# Patient Record
Sex: Male | Born: 2000 | Race: Black or African American | Hispanic: No | Marital: Single
Health system: Southern US, Community
[De-identification: ages and names within clinical notes are randomized; demographics above are authoritative.]

## PROBLEM LIST (undated history)

## (undated) ENCOUNTER — Emergency Department (HOSPITAL_COMMUNITY): Discharge status: Absent without leave | Payer: Medicaid Other

## (undated) DIAGNOSIS — F840 Autistic disorder: Secondary | ICD-10-CM

## (undated) DIAGNOSIS — G0490 Encephalitis and encephalomyelitis, unspecified: Secondary | ICD-10-CM

## (undated) DIAGNOSIS — F809 Developmental disorder of speech and language, unspecified: Secondary | ICD-10-CM

## (undated) DIAGNOSIS — R569 Unspecified convulsions: Secondary | ICD-10-CM

## (undated) DIAGNOSIS — E079 Disorder of thyroid, unspecified: Secondary | ICD-10-CM

## (undated) DIAGNOSIS — F79 Unspecified intellectual disabilities: Secondary | ICD-10-CM

## (undated) HISTORY — PX: GASTROSTOMY TUBE REVISION: SHX1704

## (undated) HISTORY — PX: BRAIN SURGERY: SHX531

---
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
## 2019-06-28 ENCOUNTER — Other Ambulatory Visit: Payer: Self-pay

## 2019-06-28 ENCOUNTER — Emergency Department (HOSPITAL_COMMUNITY)
Admission: EM | Admit: 2019-06-28 | Discharge: 2019-06-28 | Disposition: A | Discharge status: Home / Self Care | Payer: Medicaid Other | Attending: Emergency Medicine | Admitting: Emergency Medicine

## 2019-06-28 DIAGNOSIS — Z7722 Contact with and (suspected) exposure to environmental tobacco smoke (acute) (chronic): Secondary | ICD-10-CM | POA: Diagnosis not present

## 2019-06-28 DIAGNOSIS — F840 Autistic disorder: Secondary | ICD-10-CM | POA: Insufficient documentation

## 2019-06-28 DIAGNOSIS — Z79899 Other long term (current) drug therapy: Secondary | ICD-10-CM | POA: Insufficient documentation

## 2019-06-28 DIAGNOSIS — R569 Unspecified convulsions: Secondary | ICD-10-CM | POA: Diagnosis not present

## 2019-06-28 DIAGNOSIS — E039 Hypothyroidism, unspecified: Secondary | ICD-10-CM | POA: Diagnosis not present

## 2019-06-28 LAB — URINALYSIS, ROUTINE W REFLEX MICROSCOPIC
Bilirubin Urine: NEGATIVE
Glucose, UA: NEGATIVE mg/dL
Hgb urine dipstick: NEGATIVE
Ketones, ur: NEGATIVE mg/dL
Leukocytes,Ua: NEGATIVE
Nitrite: NEGATIVE
Protein, ur: NEGATIVE mg/dL
Specific Gravity, Urine: 1.02 (ref 1.005–1.030)
pH: 7 (ref 5.0–8.0)

## 2019-06-28 LAB — RAPID URINE DRUG SCREEN, HOSP PERFORMED
Amphetamines: NOT DETECTED
Barbiturates: NOT DETECTED
Benzodiazepines: POSITIVE — AB
Cocaine: NOT DETECTED
Opiates: NOT DETECTED
Tetrahydrocannabinol: NOT DETECTED

## 2019-06-28 LAB — CBC WITH DIFFERENTIAL/PLATELET
Abs Immature Granulocytes: 0 10*3/uL (ref 0.00–0.07)
Basophils Absolute: 0 10*3/uL (ref 0.0–0.1)
Basophils Relative: 0 %
Eosinophils Absolute: 0.1 10*3/uL (ref 0.0–0.5)
Eosinophils Relative: 3 %
HCT: 44.5 % (ref 39.0–52.0)
Hemoglobin: 14.2 g/dL (ref 13.0–17.0)
Immature Granulocytes: 0 %
Lymphocytes Relative: 46 %
Lymphs Abs: 1.6 10*3/uL (ref 0.7–4.0)
MCH: 28.9 pg (ref 26.0–34.0)
MCHC: 31.9 g/dL (ref 30.0–36.0)
MCV: 90.4 fL (ref 80.0–100.0)
Monocytes Absolute: 0.5 10*3/uL (ref 0.1–1.0)
Monocytes Relative: 13 %
Neutro Abs: 1.3 10*3/uL — ABNORMAL LOW (ref 1.7–7.7)
Neutrophils Relative %: 38 %
Platelets: 174 10*3/uL (ref 150–400)
RBC: 4.92 MIL/uL (ref 4.22–5.81)
RDW: 11.9 % (ref 11.5–15.5)
WBC: 3.5 10*3/uL — ABNORMAL LOW (ref 4.0–10.5)
nRBC: 0 % (ref 0.0–0.2)

## 2019-06-28 LAB — COMPREHENSIVE METABOLIC PANEL
ALT: 18 U/L (ref 0–44)
AST: 18 U/L (ref 15–41)
Albumin: 3.9 g/dL (ref 3.5–5.0)
Alkaline Phosphatase: 81 U/L (ref 38–126)
Anion gap: 7 (ref 5–15)
BUN: 9 mg/dL (ref 6–20)
CO2: 28 mmol/L (ref 22–32)
Calcium: 9.4 mg/dL (ref 8.9–10.3)
Chloride: 104 mmol/L (ref 98–111)
Creatinine, Ser: 0.67 mg/dL (ref 0.61–1.24)
GFR calc Af Amer: >60 mL/min (ref >60)
GFR calc non Af Amer: >60 mL/min (ref >60)
Glucose, Bld: 99 mg/dL (ref 70–99)
Potassium: 4.1 mmol/L (ref 3.5–5.1)
Sodium: 139 mmol/L (ref 135–145)
Total Bilirubin: 0.6 mg/dL (ref 0.3–1.2)
Total Protein: 7.3 g/dL (ref 6.5–8.1)

## 2019-06-28 LAB — VALPROIC ACID LEVEL: Valproic Acid Lvl: 41 ug/mL — ABNORMAL LOW (ref 50.0–100.0)

## 2019-06-28 LAB — ETHANOL: Alcohol, Ethyl (B): <10 mg/dL (ref <10)

## 2019-06-28 MED ORDER — LEVETIRACETAM IN NACL 1000 MG/100ML IV SOLN
1000.0000 mg | Freq: Two times a day (BID) | INTRAVENOUS | Status: DC
  Administered 2019-06-28: 1000 mg via INTRAVENOUS
  Filled 2019-06-28: qty 100

## 2019-06-28 MED ORDER — ACETAMINOPHEN 325 MG PO TABS
650.0000 mg | ORAL_TABLET | Freq: Once | ORAL | Status: AC
  Administered 2019-06-28: 650 mg via ORAL
  Filled 2019-06-28: qty 2

## 2019-06-28 NOTE — ED Notes (Signed)
Pt discharged in police custody. Pt ambulatory in room, following commands and speaking in full sentences.

## 2019-06-28 NOTE — ED Provider Notes (Signed)
[facility] DEPT Provider Note   CSN: [id number] Arrival date & time: 06/28/19  1832     History Chief Complaint  Patient presents with  . Seizures    David [name] is a 19 y.o. male with past medical history significant for autism, moderate intellectual disability, limbic encephalitis, seizures presents emergency department today from jail with chief complaint of seizure.  He is accompanied by 2 guards.  Patient states he had a phone call with his mother this morning and she was rude and threatening to him.  After the phone call he laid down to take a nap.  He states when he woke up he had a seizure.  He was lowered to the ground by the CO, he did not fall or hit his head.  The guards at the bedside states the seizure lasted 15 minutes however they were not there to witness it and cannot describe further describe the seizure..  Patient denies urinary or bowel incontinence, biting his tongue or postictal state.  He states he was alert and oriented after the seizure.  He does endorse a mild headache now.  He states it is located his bilateral temples.  He rates it 2 out of 10 in severity.  He denies any associated neck pain.  He not take any medications for his headache prior to arrival.  Patient does state he takes Depakote and has not missed any doses.  He denies any recent illness or antibiotic use. He also denies fever, chills, chest pain, shortness of breath, visual changes, head trauma, photophobia, phonophobia, rash, thunderclap onset.  Denies any suicidal and homicidal thoughts, denies any visual or auditory hallucinations.  Chart review shows patient has had hospitalizations for seizure like activity before.  Patient has had multiple EEGs without any findings.  Last seen in the emergency room and her seizure-like activity 6 months ago and was safely discharged back to jail.    Past Medical History:  Diagnosis Date  . Autism   . Intellectual disability      Moderate  . Limbic encephalitis   . Seizures ([place])   . Speech delay   . Thyroid disease     Patient Active Problem List   Diagnosis Date Noted  . Seizure disorder ([place]) 04/24/2018  . Seizure ([place]) 04/24/2017  . Acne 11/03/2016  . Depression 11/03/2016  . Intellectual disability 11/03/2016  . 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 Epilepsy ([place]) 11/03/2016  . 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 Hypothyroidism 04/02/2008  . RHINITIS, ALLERGIC 07/26/2006    Past Surgical History:  Procedure Laterality Date  . BRAIN SURGERY     Brain biopsy  . GASTROSTOMY TUBE REVISION         No family history on file.  Social History   Tobacco Use  . Smoking status: Passive Smoke Exposure - Never Smoker  . Smokeless tobacco: Never Used  Substance Use Topics  . Alcohol use: No  . Drug use: No    Home Medications Prior to Admission medications   Medication Sig Start Date End Date Taking? Authorizing Provider  APTIOM 800 MG TABS Take 800 mg by mouth daily with supper.  04/04/17   [provider]  divalproex (DEPAKOTE ER) 250 MG 24 hr tablet Take 1,250 mg by mouth 2 (two) times daily.    [provider]  divalproex (DEPAKOTE SPRINKLE) 125 MG capsule Take 1 capsule (125 mg total) by mouth every 12 (twelve) hours. Patient not taking: Reported  on 06/07/2018 04/24/18   Maretta Bees, MD  divalproex (DEPAKOTE) 500 MG DR tablet Take 2 tablets (1,000 mg total) by mouth every 12 (twelve) hours. Patient not taking: Reported on 06/07/2018 04/24/18   Maretta Bees, MD  Eslicarbazepine Acetate 800 MG TABS Take 800 mg by mouth daily with supper. 06/16/18   Lawyer, Cristal Deer, PA-C  levETIRAcetam (KEPPRA) 1000 MG/100ML SOLN Inject 100 mLs (1,000 mg total) into the vein 2 (two) times daily. Patient not taking: Reported on 06/07/2018 04/24/18   Maretta Bees, MD  levETIRAcetam (KEPPRA) 500 MG tablet Take 500 mg by mouth 2 (two) times daily. FOR 7  DAYS 04/26/18   [provider]  levothyroxine (SYNTHROID, LEVOTHROID) 125 MCG tablet Take 62.5 mcg by mouth daily.     [provider]  Vitamin D, Cholecalciferol, 25 MCG (1000 UT) CAPS Take 1,000 capsules by mouth daily. 04/24/18   Ghimire, Werner Lean, MD    Allergies    Penicillins  Review of Systems   Review of Systems  All other systems are reviewed and are negative for acute change except as noted in the HPI.   Physical Exam Updated Vital Signs BP 120/84 (BP Location: Left Arm)   Pulse 79   Temp 98.7 F (37.1 C) (Oral)   Resp 19   SpO2 99%   Physical Exam Vitals and nursing note reviewed.  Constitutional:      General: He is not in acute distress.    Appearance: He is not ill-appearing.  HENT:     Head: Normocephalic and atraumatic.     Comments: No tenderness to palpation of skull. No deformities or crepitus noted. No open wounds, abrasions or lacerations.     Right Ear: Tympanic membrane and external ear normal.     Left Ear: Tympanic membrane and external ear normal.     Nose: Nose normal.     Mouth/Throat:     Mouth: Mucous membranes are moist.     Pharynx: Oropharynx is clear.  Eyes:     General: No scleral icterus.       Right eye: No discharge.        Left eye: No discharge.     Extraocular Movements: Extraocular movements intact.     Conjunctiva/sclera: Conjunctivae normal.     Pupils: Pupils are equal, round, and reactive to light.  Neck:     Vascular: No JVD.  Cardiovascular:     Rate and Rhythm: Normal rate and regular rhythm.     Pulses: Normal pulses.          Radial pulses are 2+ on the right side and 2+ on the left side.     Heart sounds: Normal heart sounds.  Pulmonary:     Comments: Lungs clear to auscultation in all fields. Symmetric chest rise. No wheezing, rales, or rhonchi. Abdominal:     Comments: Abdomen is soft, non-distended, and non-tender in all quadrants. No rigidity, no guarding. No peritoneal signs.    Musculoskeletal:        General: Normal range of motion.     Cervical back: Normal range of motion.  Skin:    General: Skin is warm and dry.     Capillary Refill: Capillary refill takes less than 2 seconds.  Neurological:     Mental Status: He is oriented to person, place, and time.     GCS: GCS eye subscore is 4. GCS verbal subscore is 5. GCS motor subscore is 6.  Comments: Fluent speech, no facial droop.  Speech is clear and goal oriented, follows commands CN III-XII intact, no facial droop Normal strength in upper and lower extremities bilaterally including dorsiflexion and plantar flexion, strong and equal grip strength Sensation normal to light and sharp touch Moves extremities without ataxia, coordination intact    Psychiatric:        Mood and Affect: Affect is flat.        Behavior: Behavior is withdrawn.        Thought Content: Thought content does not include homicidal or suicidal ideation.       ED Results / Procedures / Treatments   Labs (all labs ordered are listed, but only abnormal results are displayed) Labs Reviewed  VALPROIC ACID LEVEL - Abnormal; Notable for the following components:      Result Value   Valproic Acid Lvl 41 (*)    All other components within normal limits  CBC WITH DIFFERENTIAL/PLATELET - Abnormal; Notable for the following components:   WBC 3.5 (*)    Neutro Abs 1.3 (*)    All other components within normal limits  ETHANOL  COMPREHENSIVE METABOLIC PANEL  RAPID URINE DRUG SCREEN, HOSP PERFORMED  URINALYSIS, ROUTINE W REFLEX MICROSCOPIC    EKG EKG Interpretation  Date/Time:  Saturday June 28 2019 18:43:10 EST Ventricular Rate:  83 PR Interval:    QRS Duration: 89 QT Interval:  326 QTC Calculation: 383 R Axis:   66 Text Interpretation: Sinus rhythm LVH by voltage Confirmed by Virgina Norfolk [phone]) on 06/28/2019 6:48:45 PM   Radiology No results found.  Procedures Procedures (including critical care  time)  Medications Ordered in ED Medications  levETIRAcetam (KEPPRA) IVPB 1000 mg/100 mL premix (has no administration in time range)  acetaminophen (TYLENOL) tablet 650 mg (650 mg Oral Given 06/28/19 2012)    ED Course  I have reviewed the triage vital signs and the nursing notes.  Pertinent labs & imaging results that were available during my care of the patient were reviewed by me and considered in my medical decision making (see chart for details).  Clinical Course as of Jun 27 2134  Sat Jun 28, 2019  2041 RN informs me when she was starting an IV patient had an episode of upper body shaking.  RN states she got stern and told him to stop which he did.  He was alert and oriented and conversational while having this shaking.   [KA]    Clinical Course User Index [KA] Axelle Szwed, Caroleen Hamman, PA-C   MDM Rules/Calculators/A&P                      Patient seen and examined.  He is overall well-appearing, no acute distress.  He is not postictal.  He does have flat affect and is withdrawn.  Later in my exam he is more interactive and participates.  His neuro exam is normal.  There are no signs of oral injury.  He is back to his baseline. CBC with leukopenia 3.5, similar when compared to prior.  CMP unremarkable.  Ethanol level is negative.  Alcohol level 41, subtherapeutic.  Please see ED course above regarding seizure like activity.  I reassessed patient after seizure-like activity and he was normal, no postictal state.  Discussed with on-call neurologist Dr. Laurence Slate who recommends giving small Depakote loading dose as he is subtherapeutic today.  His presentation and activity here is not consistent with seizures.  Dr. Laurence Slate and I reviewed patient's history has  had EEGs and no seizure activity has been seen.  No recommendations will be made to patient's Depakote dose.  He will need to have his level rechecked within 1 to 2 weeks. Patient at baseline after Keppra load, safe to DC back to jail.  The  patient appears reasonably screened and/or stabilized for discharge and I doubt any other medical condition or other [facility] requiring further screening, evaluation, or treatment in the ED at this time prior to discharge. The patient is safe for discharge with strict return precautions discussed. Recommend pcp follow up to have Depakote level rechecked as stated above. Findings and plan of care discussed with supervising physician Dr. Lockie Mola.   Portions of this note were generated with Scientist, clinical (histocompatibility and immunogenetics). Dictation errors may occur despite best attempts at proofreading.    Final Clinical Impression(s) / ED Diagnoses Final diagnoses:  Seizure-like activity [facility])    Rx / DC Orders ED Discharge Orders    None       Sherene Sires, PA-C 06/28/19 2211    Virgina Norfolk, DO 06/29/19 1156

## 2019-06-28 NOTE — ED Notes (Signed)
Pt aware of the need for urine sample. Pt provided with urinal and cup of water.

## 2019-06-28 NOTE — ED Triage Notes (Signed)
Pt BIB [facility] from jail due to seizure like activity. Per jail pt had convulsions for 30 min, no loss of bowel or bladder, no injury to mouth, pt was assisted to the floor by staff. EMS administered 5mg  of Midazolam and convulsions immediately stopped. Pt will open eyes and shake head yes or no. Will not answer questions at this time. Pt has hx of seizures. Pt has been taking meds as prescribed and  Has not missed any doses.

## 2019-06-28 NOTE — Discharge Instructions (Addendum)
You have been seen today for seizure like activity. Please read and follow all provided instructions. Return to the emergency room for worsening condition or new concerning symptoms.    Your Depakote level was slightly low.  1. Medications:  Prescription- continue Depakote as prescribed.   Continue usual home medications Take medications as prescribed. Please review all of the medicines and only take them if you do not have an allergy to them.   2. Treatment: rest, drink plenty of fluids  3. Follow Up:  Please follow up with primary care provider in 1-2 weeks to have your depakote level rechecked.   It is also a possibility that you have an allergic reaction to any of the medicines that you have been prescribed - Everybody reacts differently to medications and while MOST people have no trouble with most medicines, you may have a reaction such as nausea, vomiting, rash, swelling, shortness of breath. If this is the case, please stop taking the medicine immediately and contact your physician.  ?

## 2019-06-28 NOTE — ED Notes (Signed)
Pt is sitting up in bed, laughing at the TV. Pt verbalized he wanted me to help hip put his gown back on. Pt is also requesting something to eat.

## 2019-06-28 NOTE — ED Notes (Signed)
Around 1930, Clinical research associate of this note was called into the room by another RN due to seizure like activity. Pt was alert during episode, followed commands and when asked to stop in order to place IV, pt stopped with the convulsions. IV was placed and no other convulsions were witnessed at the time.

## 2019-09-21 ENCOUNTER — Other Ambulatory Visit: Payer: Self-pay

## 2019-09-21 ENCOUNTER — Emergency Department (HOSPITAL_COMMUNITY)
Admission: EM | Admit: 2019-09-21 | Discharge: 2019-09-21 | Disposition: A | Discharge status: Home / Self Care | Payer: Medicaid Other | Attending: Emergency Medicine | Admitting: Emergency Medicine

## 2019-09-21 DIAGNOSIS — Z79899 Other long term (current) drug therapy: Secondary | ICD-10-CM | POA: Insufficient documentation

## 2019-09-21 DIAGNOSIS — R569 Unspecified convulsions: Secondary | ICD-10-CM | POA: Diagnosis present

## 2019-09-21 DIAGNOSIS — F840 Autistic disorder: Secondary | ICD-10-CM | POA: Insufficient documentation

## 2019-09-21 DIAGNOSIS — E039 Hypothyroidism, unspecified: Secondary | ICD-10-CM | POA: Insufficient documentation

## 2019-09-21 LAB — CBG MONITORING, ED: Glucose-Capillary: 85 mg/dL (ref 70–99)

## 2019-09-21 LAB — CBC
HCT: 46.7 % (ref 39.0–52.0)
Hemoglobin: 15.4 g/dL (ref 13.0–17.0)
MCH: 29.9 pg (ref 26.0–34.0)
MCHC: 33 g/dL (ref 30.0–36.0)
MCV: 90.7 fL (ref 80.0–100.0)
Platelets: 199 10*3/uL (ref 150–400)
RBC: 5.15 MIL/uL (ref 4.22–5.81)
RDW: 13.2 % (ref 11.5–15.5)
WBC: 4.2 10*3/uL (ref 4.0–10.5)
nRBC: 0 % (ref 0.0–0.2)

## 2019-09-21 LAB — BASIC METABOLIC PANEL
Anion gap: 10 (ref 5–15)
BUN: 17 mg/dL (ref 6–20)
CO2: 26 mmol/L (ref 22–32)
Calcium: 9.7 mg/dL (ref 8.9–10.3)
Chloride: 102 mmol/L (ref 98–111)
Creatinine, Ser: 1.11 mg/dL (ref 0.61–1.24)
GFR calc Af Amer: >60 mL/min (ref >60)
GFR calc non Af Amer: >60 mL/min (ref >60)
Glucose, Bld: 88 mg/dL (ref 70–99)
Potassium: 5.8 mmol/L — ABNORMAL HIGH (ref 3.5–5.1)
Sodium: 138 mmol/L (ref 135–145)

## 2019-09-21 LAB — POTASSIUM: Potassium: 4.7 mmol/L (ref 3.5–5.1)

## 2019-09-21 MED ORDER — LEVETIRACETAM 500 MG PO TABS
500.0000 mg | ORAL_TABLET | Freq: Once | ORAL | Status: AC
  Administered 2019-09-21: 17:00:00 500 mg via ORAL
  Filled 2019-09-21: qty 1

## 2019-09-21 NOTE — ED Notes (Signed)
[phone], Arnetha Courser, [place] [employer], legal guardian, would like updates on patient and will give him a ride home.

## 2019-09-21 NOTE — ED Provider Notes (Signed)
[facility]-EMERGENCY DEPT Provider Note   CSN: [id number] Arrival date & time: 09/21/19  1256     History Chief Complaint  Patient presents with  . Seizures    David [name] is a 19 y.o. male.  Patient presents to the ED with a chief complaint of seizure.  He is from [facility] and reportedly had two full body seizures.  He has a history of the same.  He states that he has been taking his Keppra.  He reports mild right flank pain, but denies any other symptoms.  He denies any tongue biting or falling.  He reportedly was able to sit down prior to having the seizure because he was feeling "hot."  He has no complaints now and is requesting to leave.  The history is provided by the patient. No language interpreter was used.       Past Medical History:  Diagnosis Date  . Autism   . Intellectual disability    Moderate  . Limbic encephalitis   . Seizures (HCC)   . Speech delay   . Thyroid disease     Patient Active Problem List   Diagnosis Date Noted  . Seizure disorder (HCC) 04/24/2018  . Seizure (HCC) 04/24/2017  . Acne 11/03/2016  . Depression 11/03/2016  . Intellectual disability 11/03/2016  . 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 Hypothyroidism 04/02/2008  . RHINITIS, ALLERGIC 07/26/2006    Past Surgical History:  Procedure Laterality Date  . BRAIN SURGERY     Brain biopsy  . GASTROSTOMY TUBE REVISION         No family history on file.  Social History   Tobacco Use  . Smoking status: Passive Smoke Exposure - Never Smoker  . Smokeless tobacco: Never Used  Substance Use Topics  . Alcohol use: No  . Drug use: No    Home Medications Prior to Admission medications   Medication Sig Start Date End Date Taking? Authorizing Provider  APTIOM 800 MG TABS Take 800 mg by mouth daily with supper.  04/04/17   [provider]  divalproex  (DEPAKOTE ER) 250 MG 24 hr tablet Take 1,250 mg by mouth 2 (two) times daily.    [provider]  divalproex (DEPAKOTE SPRINKLE) 125 MG capsule Take 1 capsule (125 mg total) by mouth every 12 (twelve) hours. Patient not taking: Reported on 06/07/2018 04/24/18   Maretta Bees, MD  divalproex (DEPAKOTE) 500 MG DR tablet Take 2 tablets (1,000 mg total) by mouth every 12 (twelve) hours. Patient not taking: Reported on 06/07/2018 04/24/18   Maretta Bees, MD  Eslicarbazepine Acetate 800 MG TABS Take 800 mg by mouth daily with supper. 06/16/18   Lawyer, Cristal Deer, PA-C  levETIRAcetam (KEPPRA) 1000 MG/100ML SOLN Inject 100 mLs (1,000 mg total) into the vein 2 (two) times daily. Patient not taking: Reported on 06/07/2018 04/24/18   Maretta Bees, MD  levETIRAcetam (KEPPRA) 500 MG tablet Take 500 mg by mouth 2 (two) times daily. FOR 7 DAYS 04/26/18   [provider]  levothyroxine (SYNTHROID, LEVOTHROID) 125 MCG tablet Take 62.5 mcg by mouth daily.     [provider]  Vitamin D, Cholecalciferol, 25 MCG (1000 UT) CAPS Take 1,000 capsules by mouth daily. 04/24/18   Ghimire, Werner Lean, MD    Allergies    Penicillins  Review of Systems   Review of Systems  All other systems  reviewed and are negative.   Physical Exam Updated Vital Signs BP (!) 144/69 (BP Location: Left Arm)   Pulse 65   Temp 98 F (36.7 C) (Oral)   Resp 17   SpO2 98%   Physical Exam Vitals and nursing note reviewed.  Constitutional:      Appearance: He is well-developed.  HENT:     Head: Normocephalic and atraumatic.  Eyes:     Conjunctiva/sclera: Conjunctivae normal.  Cardiovascular:     Rate and Rhythm: Normal rate and regular rhythm.     Heart sounds: No murmur.  Pulmonary:     Effort: Pulmonary effort is normal. No respiratory distress.     Breath sounds: Normal breath sounds.  Abdominal:     Palpations: Abdomen is soft.     Tenderness: There is no abdominal tenderness.    Musculoskeletal:        General: Normal range of motion.     Cervical back: Neck supple.  Skin:    General: Skin is warm and dry.  Neurological:     Mental Status: He is alert and oriented to person, place, and time. Mental status is at baseline.     Comments: At baseline (reported developmental age is 63-6yo)  Psychiatric:        Behavior: Behavior normal.     ED Results / Procedures / Treatments   Labs (all labs ordered are listed, but only abnormal results are displayed) Labs Reviewed  BASIC METABOLIC PANEL - Abnormal; Notable for the following components:      Result Value   Potassium 5.8 (*)    All other components within normal limits  CBC  POTASSIUM  CBG MONITORING, ED    EKG EKG Interpretation  Date/Time:  Sunday September 21 2019 13:27:33 EDT Ventricular Rate:  67 PR Interval:    QRS Duration: 95 QT Interval:  344 QTC Calculation: 364 R Axis:   68 Text Interpretation: Sinus rhythm ST elev, probable normal early repol pattern Since last tracing repolarization abnormality is new Confirmed by [name] [id number]) on 09/21/2019 1:31:21 PM   Radiology No results found.  Procedures Procedures (including critical care time)  Medications Ordered in ED Medications - No data to display  ED Course  I have reviewed the triage vital signs and the nursing notes.  Pertinent labs & imaging results that were available during my care of the patient were reviewed by me and considered in my medical decision making (see chart for details).    MDM Rules/Calculators/A&P                      This patient complains of seizure, this involves an extensive number of treatment options, and is a complaint that carries with it a high risk of complications and morbidity.  The differential diagnosis includes non-compliance, infection lowering seizure threshold, electrolyte abnormality.  I ordered, reviewed, and interpreted labs, which included CBC, BMP, CBG which is notable for K of  5.8, Cr normal.  Suspect lab error, will recheck.  K on recheck is 4.7 without intervention, first was likely hemolysis. I ordered medication keppra for seizure. Additional history obtained from DSS case worker/guardian and prior foster mother. Previous records obtained and reviewed hx of seizure.    After the interventions stated above, I reevaluated the patient and found back to baseline per foster mom.    Patient going to shelter arranged by [facility].    Final Clinical Impression(s) / ED Diagnoses Final diagnoses:  Seizure [facility])    Rx / DC Orders ED Discharge Orders    None       Roxy Horseman, Cordelia Poche 09/21/19 1702    Benjiman Core, MD 09/22/19 1459

## 2019-09-21 NOTE — ED Triage Notes (Signed)
Pt to ED by [facility] with c/o of 2 seizures today. Pt is from [employer].  Pt started to feel hot so sat down, and proceed to have grand mal seizure witnessed by by standers outside. Pt is complaining of some left flank pain, but did not have a fall with seizure. Pt hx has of seizures and states he's been taking the Keppra he is prescribed.

## 2019-09-21 NOTE — ED Notes (Addendum)
PT provided cup of apple juice and ham sandwich.

## 2019-09-21 NOTE — ED Notes (Addendum)
PT insisted on not wearing gown, PT remained on cell phone during initial vitals and placement of EKG, PT asked "When can I leave?"

## 2019-09-25 ENCOUNTER — Emergency Department (HOSPITAL_COMMUNITY)
Admission: EM | Admit: 2019-09-25 | Discharge: 2019-09-26 | Disposition: A | Discharge status: Home / Self Care | Payer: Medicaid Other | Attending: Emergency Medicine | Admitting: Emergency Medicine

## 2019-09-25 ENCOUNTER — Other Ambulatory Visit: Payer: Self-pay

## 2019-09-25 DIAGNOSIS — Z79899 Other long term (current) drug therapy: Secondary | ICD-10-CM | POA: Diagnosis not present

## 2019-09-25 DIAGNOSIS — Z7722 Contact with and (suspected) exposure to environmental tobacco smoke (acute) (chronic): Secondary | ICD-10-CM | POA: Insufficient documentation

## 2019-09-25 DIAGNOSIS — F840 Autistic disorder: Secondary | ICD-10-CM | POA: Insufficient documentation

## 2019-09-25 DIAGNOSIS — R569 Unspecified convulsions: Secondary | ICD-10-CM | POA: Diagnosis present

## 2019-09-25 LAB — CBC WITH DIFFERENTIAL/PLATELET
Abs Immature Granulocytes: 0.01 10*3/uL (ref 0.00–0.07)
Basophils Absolute: 0 10*3/uL (ref 0.0–0.1)
Basophils Relative: 0 %
Eosinophils Absolute: 0.2 10*3/uL (ref 0.0–0.5)
Eosinophils Relative: 5 %
HCT: 45.4 % (ref 39.0–52.0)
Hemoglobin: 14.8 g/dL (ref 13.0–17.0)
Immature Granulocytes: 0 %
Lymphocytes Relative: 53 %
Lymphs Abs: 2.4 10*3/uL (ref 0.7–4.0)
MCH: 29.2 pg (ref 26.0–34.0)
MCHC: 32.6 g/dL (ref 30.0–36.0)
MCV: 89.5 fL (ref 80.0–100.0)
Monocytes Absolute: 0.5 10*3/uL (ref 0.1–1.0)
Monocytes Relative: 10 %
Neutro Abs: 1.5 10*3/uL — ABNORMAL LOW (ref 1.7–7.7)
Neutrophils Relative %: 32 %
Platelets: 192 10*3/uL (ref 150–400)
RBC: 5.07 MIL/uL (ref 4.22–5.81)
RDW: 13.1 % (ref 11.5–15.5)
WBC: 4.7 10*3/uL (ref 4.0–10.5)
nRBC: 0 % (ref 0.0–0.2)

## 2019-09-25 LAB — BASIC METABOLIC PANEL
Anion gap: 6 (ref 5–15)
BUN: 19 mg/dL (ref 6–20)
CO2: 26 mmol/L (ref 22–32)
Calcium: 8.9 mg/dL (ref 8.9–10.3)
Chloride: 105 mmol/L (ref 98–111)
Creatinine, Ser: 0.9 mg/dL (ref 0.61–1.24)
GFR calc Af Amer: >60 mL/min (ref >60)
GFR calc non Af Amer: >60 mL/min (ref >60)
Glucose, Bld: 151 mg/dL — ABNORMAL HIGH (ref 70–99)
Potassium: 4.9 mmol/L (ref 3.5–5.1)
Sodium: 137 mmol/L (ref 135–145)

## 2019-09-25 LAB — VALPROIC ACID LEVEL: Valproic Acid Lvl: 73 ug/mL (ref 50.0–100.0)

## 2019-09-25 MED ORDER — LEVETIRACETAM IN NACL 1000 MG/100ML IV SOLN
1000.0000 mg | Freq: Once | INTRAVENOUS | Status: AC
  Administered 2019-09-25: 23:00:00 1000 mg via INTRAVENOUS
  Filled 2019-09-25: qty 100

## 2019-09-25 NOTE — ED Triage Notes (Signed)
Pt brought in by EMS for x2 witnessed seizures at home. EMS witnessed a seizure on the way to the hospital and gave 2.5mg  valium. Pt post ictal upon arrival.

## 2019-09-25 NOTE — ED Provider Notes (Signed)
[facility]-EMERG[facility] DEPT Provider Note   CSN: [id number] Arrival date & time: 09/25/19  2147     History Chief Complaint  Patient presents with  . Seizures    CONRAD ZAJKOWSKI is a 19 y.o. male.  The history is provided by the patient and medical records.  Seizures   LEVEL V CAVEAT:  AUTISM, DEVELOPMENTAL DELAY, POST-ICTAL STATE  19 y.o. M with hx of autism, intellectual disability, speech delay, thyroid disease, hx of seizures, presenting to the ED for witnessed seizure x2 PTA at home.  EMS was called, had another seizure with them and was given 2.5mg  versed.  Patient is post-ictal upon arrival.  No family at bedside.  No other details are known at this time.  Past Medical History:  Diagnosis Date  . Autism   . Intellectual disability    Moderate  . Limbic encephalitis   . Seizures (HCC)   . Speech delay   . Thyroid disease     Patient Active Problem List   Diagnosis Date Noted  . Seizure disorder (HCC) 04/24/2018  . Seizure (HCC) 04/24/2017  . Acne 11/03/2016  . Depression 11/03/2016  . Intellectual disability 11/03/2016  . 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 Hypothyroidism 04/02/2008  . RHINITIS, ALLERGIC 07/26/2006    Past Surgical History:  Procedure Laterality Date  . BRAIN SURGERY     Brain biopsy  . GASTROSTOMY TUBE REVISION         No family history on file.  Social History   Tobacco Use  . Smoking status: Passive Smoke Exposure - Never Smoker  . Smokeless tobacco: Never Used  Substance Use Topics  . Alcohol use: No  . Drug use: No    Home Medications Prior to Admission medications   Medication Sig Start Date End Date Taking? Authorizing Provider  APTIOM 800 MG TABS Take 800 mg by mouth daily with supper.  04/04/17   [provider]  Cholecalciferol 25 MCG (1000 UT) tablet Take 1,000 Units by mouth daily. LF per  [facility] 3.30.21 08/26/19 09/25/19  [provider]  diazepam (DIASTAT ACUDIAL) 10 MG GEL Place 5 mg rectally as needed for seizure. Do not use for more than 5 episodes per month or more than 1 episode every 5 days. Up to 10 doses may be repeated every 4-12 hours if needed. LF per [facility] 3.30.21 08/26/19   [provider]  divalproex (DEPAKOTE ER) 250 MG 24 hr tablet Take 1,250 mg by mouth in the morning and at bedtime. LF per [facility] 3.30.21 08/26/19 09/25/19  [provider]  Eslicarbazepine Acetate 800 MG TABS Take 800 mg by mouth daily with supper. 06/16/18   Lawyer, Cristal Deer, PA-C  levETIRAcetam (KEPPRA) 1000 MG/100ML SOLN Inject 100 mLs (1,000 mg total) into the vein 2 (two) times daily. Patient not taking: Reported on 06/07/2018 04/24/18   Maretta Bees, MD  levETIRAcetam (KEPPRA) 500 MG tablet Take 500 mg by mouth 2 (two) times daily. FOR 7 DAYS 04/26/18   [provider]  levothyroxine (SYNTHROID) 125 MCG tablet Take 62.5 mcg by mouth daily. Take atleast 30-60 minutes before breakfast. 08/26/19 09/25/19  [provider]    Allergies    Penicillins  Review of Systems   Review of Systems  Unable to perform ROS: Other    Physical Exam Updated Vital Signs BP (!) 109/55  Pulse (!) 51   Resp 16   SpO2 97%   Physical Exam Vitals and nursing note reviewed.  Constitutional:      Appearance: He is well-developed.  HENT:     Head: Normocephalic and atraumatic.  Eyes:     Conjunctiva/sclera: Conjunctivae normal.     Pupils: Pupils are equal, round, and reactive to light.  Cardiovascular:     Rate and Rhythm: Normal rate and regular rhythm.     Heart sounds: Normal heart sounds.  Pulmonary:     Effort: Pulmonary effort is normal.     Breath sounds: Normal breath sounds.  Abdominal:     General: Bowel sounds are normal.     Palpations: Abdomen is soft.  Musculoskeletal:         General: Normal range of motion.     Cervical back: Normal range of motion.  Skin:    General: Skin is warm and dry.  Neurological:     Comments: Post-ictal on arrival, not able to follow commands or answer questions at this time     ED Results / Procedures / Treatments   Labs (all labs ordered are listed, but only abnormal results are displayed) Labs Reviewed  CBC WITH DIFFERENTIAL/PLATELET - Abnormal; Notable for the following components:      Result Value   Neutro Abs 1.5 (*)    All other components within normal limits  BASIC METABOLIC PANEL - Abnormal; Notable for the following components:   Glucose, Bld 151 (*)    All other components within normal limits  VALPROIC ACID LEVEL    EKG EKG Interpretation  Date/Time:  Thursday September 25 2019 22:05:09 EDT Ventricular Rate:  68 PR Interval:    QRS Duration: 92 QT Interval:  360 QTC Calculation: 383 R Axis:   70 Text Interpretation: Sinus rhythm ST elev, probable normal early repol pattern Confirmed by Geoffery Lyons ([id number]) on 09/25/2019 10:39:47 PM   Radiology No results found.  Procedures Procedures (including critical care time)  Medications Ordered in ED Medications  levETIRAcetam (KEPPRA) IVPB 1000 mg/100 mL premix (0 mg Intravenous Stopped 09/25/19 2303)    ED Course  I have reviewed the triage vital signs and the nursing notes.  Pertinent labs & imaging results that were available during my care of the patient were reviewed by me and considered in my medical decision making (see chart for details).    MDM Rules/Calculators/A&P  19 year old male presenting to the ED after witnessed seizure at home x2 and subsequent seizure with EMS.  He was given Versed by EMS.  He is postictal on arrival.    Patient's vitals are stable.  He is resting comfortably.  No further seizure activity observed.  Chart reviewed, patient seen on 09/21/2019 in ED for seizure as well.  He was seen by neurology yesterday and his  Depakote was increased.  Will obtain screening labs, EKG.  Given IV Keppra.    Labs reassuring, Depakote level is therapeutic.  EKG non-ischemic.  Patient remains drowsy after versed.  Will continue to monitor.  4:21 AM Patient has been reassessed a few times throughout the night.  He awakes to voice but falls back asleep very quickly.  Still appears somewhat drowsy from medications.  Will observe a bit longer until more awake/alert.  5:13 AM Patient now much more awake/alert.  States he still feels tired but otherwise no complaints.  VS remain stable without further seizure activity.  As his Depakote was just increased yesterday, it will likely  take some time for this to take effect.  At this point, I do not feel he needs further acute intervention or hospitalization. I have called and spoke with patient's legal guardian, Caren Griffins-- we have reviewed plan of care, she acknowledged understanding.  She will come pick patient up this morning.  Final Clinical Impression(s) / ED Diagnoses Final diagnoses:  Seizure [facility])    Rx / [place] Orders ED Discharge Orders    None       Larene Pickett, PA-C 09/26/19 1610    Little, Wenda Overland, MD 09/29/19 1505

## 2019-09-25 NOTE — ED Notes (Signed)
Pt lying in bed, eye's closed, chest rising and falling. Pt post ictal and lethargic. Full montior placed. Will continue to monitor.

## 2019-09-25 NOTE — ED Notes (Signed)
Pt lying in bed, eye's closed, chest rising and falling. Pt opens eyes to sound, but falls back asleep. Will continue to monitor.

## 2019-09-26 NOTE — ED Notes (Signed)
Pt lying in bed eyes closed, chest rising and falling. Pt opens eyes when called but falls back asleep. Full monitor on. Pt repositioned in bed. Will continue to monitor.

## 2019-09-26 NOTE — ED Notes (Signed)
Pt lying in bed, eye's closed, chest rising and falling. Full monitor on. Pt opens his eyes to sound but falls back asleep. Will continue to monitor.

## 2019-09-26 NOTE — ED Notes (Signed)
Pt lying in bed, eye's closed, chest rising. NAD noted. Shelter updated on pt care, will be in around 0730 for pick up. Will continue to monitor.

## 2019-09-26 NOTE — ED Notes (Signed)
Pt sitting up in bed awake. Pt not speaking but responds to yes or no questions with nods. Pt lying back down. Will continue to monitor.

## 2019-09-26 NOTE — Discharge Instructions (Signed)
Continue all medications as prescribed. Follow-up with your neurologist. Return here for any new/acute changes.

## 2019-09-26 NOTE — ED Notes (Signed)
Pt lying in bed, eyes closed, chest rising and falling.  

## 2019-09-26 NOTE — ED Notes (Signed)
Pt lying in bed. Eye's closed, chest rising and falling.

## 2019-09-26 NOTE — ED Notes (Signed)
Pt lying in bed, eye's closed, chest rising and falling. Will continue to monitor.  

## 2019-09-27 ENCOUNTER — Emergency Department (HOSPITAL_COMMUNITY)
Admission: EM | Admit: 2019-09-27 | Discharge: 2019-09-28 | Disposition: A | Discharge status: Home / Self Care | Payer: Medicaid Other | Attending: Emergency Medicine | Admitting: Emergency Medicine

## 2019-09-27 ENCOUNTER — Encounter (HOSPITAL_COMMUNITY): Payer: Self-pay | Admitting: *Deleted

## 2019-09-27 DIAGNOSIS — F4325 Adjustment disorder with mixed disturbance of emotions and conduct: Secondary | ICD-10-CM | POA: Diagnosis not present

## 2019-09-27 DIAGNOSIS — F840 Autistic disorder: Secondary | ICD-10-CM | POA: Diagnosis not present

## 2019-09-27 DIAGNOSIS — R410 Disorientation, unspecified: Secondary | ICD-10-CM | POA: Diagnosis not present

## 2019-09-27 DIAGNOSIS — Z20822 Contact with and (suspected) exposure to covid-19: Secondary | ICD-10-CM | POA: Insufficient documentation

## 2019-09-27 DIAGNOSIS — Z7722 Contact with and (suspected) exposure to environmental tobacco smoke (acute) (chronic): Secondary | ICD-10-CM | POA: Insufficient documentation

## 2019-09-27 DIAGNOSIS — R4689 Other symptoms and signs involving appearance and behavior: Secondary | ICD-10-CM | POA: Insufficient documentation

## 2019-09-27 DIAGNOSIS — Z046 Encounter for general psychiatric examination, requested by authority: Secondary | ICD-10-CM | POA: Diagnosis present

## 2019-09-27 DIAGNOSIS — R569 Unspecified convulsions: Secondary | ICD-10-CM | POA: Insufficient documentation

## 2019-09-27 LAB — RESPIRATORY PANEL BY RT PCR (FLU A&B, COVID)
Influenza A by PCR: NEGATIVE
Influenza B by PCR: NEGATIVE
SARS Coronavirus 2 by RT PCR: NEGATIVE

## 2019-09-27 LAB — VALPROIC ACID LEVEL: Valproic Acid Lvl: 93 ug/mL (ref 50.0–100.0)

## 2019-09-27 LAB — CBG MONITORING, ED: Glucose-Capillary: 106 mg/dL — ABNORMAL HIGH (ref 70–99)

## 2019-09-27 MED ORDER — LEVOTHYROXINE SODIUM 50 MCG PO TABS
62.5000 ug | ORAL_TABLET | Freq: Every day | ORAL | Status: DC
  Administered 2019-09-27 – 2019-09-28 (×2): 62.5 ug via ORAL
  Filled 2019-09-27 (×2): qty 1

## 2019-09-27 MED ORDER — ACETAMINOPHEN 325 MG PO TABS: 650.0000 mg | ORAL_TABLET | ORAL | Status: DC | PRN

## 2019-09-27 MED ORDER — ESLICARBAZEPINE ACETATE 800 MG PO TABS: 800.0000 mg | ORAL_TABLET | Freq: Every day | ORAL | Status: DC

## 2019-09-27 MED ORDER — ALUM & MAG HYDROXIDE-SIMETH 200-200-20 MG/5ML PO SUSP: 30.0000 mL | Freq: Four times a day (QID) | ORAL | Status: DC | PRN

## 2019-09-27 MED ORDER — ONDANSETRON HCL 4 MG PO TABS: 4.0000 mg | ORAL_TABLET | Freq: Three times a day (TID) | ORAL | Status: DC | PRN

## 2019-09-27 MED ORDER — DIVALPROEX SODIUM ER 500 MG PO TB24
1500.0000 mg | ORAL_TABLET | Freq: Two times a day (BID) | ORAL | Status: DC
  Administered 2019-09-27 – 2019-09-28 (×2): 1500 mg via ORAL
  Filled 2019-09-27 (×3): qty 3

## 2019-09-27 MED ORDER — OLANZAPINE 10 MG PO TBDP
10.0000 mg | ORAL_TABLET | Freq: Once | ORAL | Status: AC
  Administered 2019-09-27: 10 mg via ORAL
  Filled 2019-09-27: qty 1
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
## 2019-09-27 NOTE — ED Notes (Signed)
Spoke to case Warehouse manager ([phone]). She is concerned he will be thrown out of shelter for aggressive behavior. She would like him to have psych evaluation.

## 2019-09-27 NOTE — ED Notes (Signed)
Pt lying in bed, eye's closed, chest rising and fallling. NAD noted. Will continue to monitor.

## 2019-09-27 NOTE — ED Notes (Signed)
Pt lying in bed, refuses to take nighttime medications. When asked to take his meds he kicks his feet and arms and rolls around in the bed. MD notified.

## 2019-09-27 NOTE — ED Triage Notes (Signed)
Per EMS, pt had seizure lasting 5 minutes at 1330 this afternoon.  Pt has hx of autism and seizures. He lives at homeless shelter since his father died and has not had regular medications. Pt's case manager said he is becoming more nonverbal and had multiple anger outbursts. Pt has only said "I want my father back".

## 2019-09-27 NOTE — ED Provider Notes (Signed)
[facility]-EMERGENCY DEPT Provider Note   CSN: [id number] Arrival date & time: 09/27/19  1432     History Chief Complaint  Patient presents with  . Aggressive Behavior    David [name] is a 19 y.o. male.  19 yo M with a chief complaints of a possible seizure.  Reportedly per EMS the patient was at his homeless shelter and had tonic-clonic like activity and then had some confusion afterwards.  Patient has been increasingly depressed because his father recently died and he is now homeless.  History of autism and intellectual delay.  Patient is unwilling to contribute any further history.  Level 5 caveat.  The history is provided by the patient.  Illness Severity:  Mild Onset quality:  Sudden Duration:  1 day Timing:  Constant Progression:  Worsening Chronicity:  New      Past Medical History:  Diagnosis Date  . Autism   . Intellectual disability    Moderate  . Limbic encephalitis   . Seizures (HCC)   . Speech delay   . Thyroid disease     Patient Active Problem List   Diagnosis Date Noted  . Seizure disorder (HCC) 04/24/2018  . Seizure (HCC) 04/24/2017  . Acne 11/03/2016  . Depression 11/03/2016  . Intellectual disability 11/03/2016  . 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 Hypothyroidism 04/02/2008  . RHINITIS, ALLERGIC 07/26/2006    Past Surgical History:  Procedure Laterality Date  . BRAIN SURGERY     Brain biopsy  . GASTROSTOMY TUBE REVISION         No family history on file.  Social History   Tobacco Use  . Smoking status: Passive Smoke Exposure - Never Smoker  . Smokeless tobacco: Never Used  Substance Use Topics  . Alcohol use: No  . Drug use: No    Home Medications Prior to Admission medications   Medication Sig Start Date End Date Taking? Authorizing Provider  divalproex (DEPAKOTE ER) 500 MG 24 hr tablet Take 1,500 mg by  mouth 2 (two) times daily. 09/25/19  Yes [provider]  diazepam (DIASTAT ACUDIAL) 10 MG GEL Place 5 mg rectally as needed for seizure. Do not use for more than 5 episodes per month or more than 1 episode every 5 days. Up to 10 doses may be repeated every 4-12 hours if needed. LF per [facility] 3.30.21 08/26/19   [provider]  Eslicarbazepine Acetate 800 MG TABS Take 800 mg by mouth daily with supper. Patient not taking: Reported on 09/27/2019 06/16/18   Charlestine Night, PA-C  levETIRAcetam (KEPPRA) 1000 MG/100ML SOLN Inject 100 mLs (1,000 mg total) into the vein 2 (two) times daily. Patient not taking: Reported on 06/07/2018 04/24/18   Maretta Bees, MD    Allergies    Penicillins  Review of Systems   Review of Systems  Unable to perform ROS: Patient nonverbal    Physical Exam Updated Vital Signs BP (!) 124/105 (BP Location: Right Arm)   Pulse 71   Temp 98.3 F (36.8 C) (Oral)   Resp 16   SpO2 100%   Physical Exam Vitals and nursing note reviewed.  Constitutional:      Appearance: He is well-developed.  HENT:     Head: Normocephalic and atraumatic.  Eyes:     Pupils: Pupils are equal, round, and reactive to light.  Neck:     Vascular: No  JVD.  Cardiovascular:     Rate and Rhythm: Normal rate and regular rhythm.     Heart sounds: No murmur. No friction rub. No gallop.   Pulmonary:     Effort: No respiratory distress.     Breath sounds: No wheezing.  Abdominal:     General: There is no distension.     Tenderness: There is no abdominal tenderness. There is no guarding or rebound.  Musculoskeletal:        General: Normal range of motion.     Cervical back: Normal range of motion and neck supple.  Skin:    Coloration: Skin is not pale.     Findings: No rash.  Neurological:     Mental Status: He is alert.     Comments: Patient will not speak with me shakes his head when asked him to sit on the bed.  Psychiatric:         Behavior: Behavior normal.     ED Results / Procedures / Treatments   Labs (all labs ordered are listed, but only abnormal results are displayed) Labs Reviewed  CBG MONITORING, ED - Abnormal; Notable for the following components:      Result Value   Glucose-Capillary 106 (*)    All other components within normal limits  RESPIRATORY PANEL BY RT PCR (FLU A&B, COVID)  VALPROIC ACID LEVEL    EKG None  Radiology No results found.  Procedures Procedures (including critical care time)  Medications Ordered in ED Medications  alum & mag hydroxide-simeth (MAALOX/MYLANTA) 200-200-20 MG/5ML suspension 30 mL (has no administration in time range)  ondansetron (ZOFRAN) tablet 4 mg (has no administration in time range)  acetaminophen (TYLENOL) tablet 650 mg (has no administration in time range)  Eslicarbazepine Acetate TABS 800 mg (has no administration in time range)  levothyroxine (SYNTHROID) tablet 62.5 mcg (62.5 mcg Oral Given 09/27/19 1554)  Eslicarbazepine Acetate TABS 800 mg (has no administration in time range)  divalproex (DEPAKOTE ER) 24 hr tablet 1,500 mg (1,500 mg Oral Given 09/27/19 1554)  OLANZapine zydis (ZYPREXA) disintegrating tablet 10 mg (10 mg Oral Given 09/27/19 1713)    ED Course  I have reviewed the triage vital signs and the nursing notes.  Pertinent labs & imaging results that were available during my care of the patient were reviewed by me and considered in my medical decision making (see chart for details).    MDM Rules/Calculators/A&P                      19 yo M with a cc of possible seizure-like activity.  Occurred just prior to arrival.  The patient seems to be very sleepy initially, he would shake spontaneously but would stop upon touching.  His phone rang and then he spontaneously was alert and requested his phone and then had a conversation given that he would not talk to anyone in the room.   I am wondering if some of this is more behavioral than medical.   Will attempt to talk with his caseworker.  I discussed the case with his caseworker.  Unfortunately since the death of his father has been having increasing aggression and outbursts.  Reportedly she was told and after his last events today that if he had any more outbursts he would be ejected from the shelter.  She was hoping that he could be evaluated by mental health here.  Will TTS evaluate.  She is worried that he may harm himself or someone  else.  She can be contacted on her cell phone which is in the chart [phone].  TTS will obs overnight.   The patients results and plan were reviewed and discussed.   Any x-rays performed were independently reviewed by myself.   Differential diagnosis were considered with the presenting HPI.  Medications  alum & mag hydroxide-simeth (MAALOX/MYLANTA) 200-200-20 MG/5ML suspension 30 mL (has no administration in time range)  ondansetron (ZOFRAN) tablet 4 mg (has no administration in time range)  acetaminophen (TYLENOL) tablet 650 mg (has no administration in time range)  Eslicarbazepine Acetate TABS 800 mg (has no administration in time range)  levothyroxine (SYNTHROID) tablet 62.5 mcg (62.5 mcg Oral Given 09/01/34 4680)  Eslicarbazepine Acetate TABS 800 mg (has no administration in time range)  divalproex (DEPAKOTE ER) 24 hr tablet 1,500 mg (1,500 mg Oral Given 09/27/19 1554)  OLANZapine zydis (ZYPREXA) disintegrating tablet 10 mg (10 mg Oral Given 09/27/19 1713)    Vitals:   09/27/19 1455  BP: (!) 124/105  Pulse: 71  Resp: 16  Temp: 98.3 F (36.8 C)  TempSrc: Oral  SpO2: 100%    Final diagnoses:  Aggressive behavior      Final Clinical Impression(s) / ED Diagnoses Final diagnoses:  Aggressive behavior    Rx / DC Orders ED Discharge Orders    None       Deno Etienne, DO 09/27/19 2013

## 2019-09-27 NOTE — ED Notes (Signed)
Pt refuses to get in bed. Pt does not talk to staff. Pt made a phone call and had short conversation with laughter and smiling, then hung up and still did not talk to staff.

## 2019-09-28 DIAGNOSIS — F4325 Adjustment disorder with mixed disturbance of emotions and conduct: Secondary | ICD-10-CM

## 2019-09-28 MED ORDER — LEVOTHYROXINE SODIUM 125 MCG PO TABS: 62.5000 ug | ORAL_TABLET | Freq: Every day | ORAL | 0 refills | Status: DC

## 2019-09-28 NOTE — ED Notes (Signed)
Pt lying in bed, eye's closed, chest rising and falling. Will continue to monitor.  

## 2019-09-28 NOTE — ED Notes (Signed)
Pt lying in bed eye's closed, chest rising and falling. Attempted to obtain VS. Pt uncooperative with pulse ox. BP obtained. Will continue to monitor.

## 2019-09-28 NOTE — ED Notes (Signed)
Pt awake walking in the hall. Pt states he wants to go home. Pt updated on his plan of care. Will continue to monitor.

## 2019-09-28 NOTE — ED Notes (Signed)
Pt lying in bed, eyes closed, chest rising and falling.

## 2019-09-28 NOTE — Progress Notes (Signed)
CSW spoke with pt's social worker and guardian, Arnetha Courser [phone] who stated she would be able to pick the pt up this AM between 10 and 10:30am.   Ruthann Cancer MSW, [facility] Social Worker Disposition  [facility] Ph: [phone] Fax: [phone]

## 2019-09-28 NOTE — Discharge Instructions (Signed)
[facility]  [place] COVID-19 info: [email or web address] Get online care: [email or web address] Address: [address] [place], [place], [place] [address] Hours:  Closed ? Opens 8:30AM Mon Phone: [phone]

## 2019-09-28 NOTE — ED Provider Notes (Signed)
No acute complaints overnight.  Patient had refused evening beds.  Sleeping this morning.  Seen by [facility] overnight, recommended for outpatient management.  He was roomed overnight due to his autism for safety.  Anticipate likely discharge today to shelters.   Terald Sleeper, MD 09/28/19 [phone]
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
## 2019-10-05 ENCOUNTER — Encounter (HOSPITAL_COMMUNITY): Payer: Self-pay | Admitting: Behavioral Health

## 2019-10-05 ENCOUNTER — Emergency Department (HOSPITAL_COMMUNITY)
Admission: EM | Admit: 2019-10-05 | Discharge: 2019-10-06 | Disposition: A | Discharge status: Home / Self Care | Payer: Medicaid Other | Attending: Emergency Medicine | Admitting: Emergency Medicine

## 2019-10-05 ENCOUNTER — Other Ambulatory Visit: Payer: Self-pay

## 2019-10-05 DIAGNOSIS — R569 Unspecified convulsions: Secondary | ICD-10-CM | POA: Diagnosis not present

## 2019-10-05 DIAGNOSIS — F840 Autistic disorder: Secondary | ICD-10-CM | POA: Diagnosis not present

## 2019-10-05 DIAGNOSIS — R45851 Suicidal ideations: Secondary | ICD-10-CM | POA: Insufficient documentation

## 2019-10-05 DIAGNOSIS — F320 Major depressive disorder, single episode, mild: Secondary | ICD-10-CM | POA: Diagnosis not present

## 2019-10-05 DIAGNOSIS — R21 Rash and other nonspecific skin eruption: Secondary | ICD-10-CM | POA: Diagnosis not present

## 2019-10-05 DIAGNOSIS — Z20822 Contact with and (suspected) exposure to covid-19: Secondary | ICD-10-CM | POA: Insufficient documentation

## 2019-10-05 DIAGNOSIS — Z7722 Contact with and (suspected) exposure to environmental tobacco smoke (acute) (chronic): Secondary | ICD-10-CM | POA: Diagnosis not present

## 2019-10-05 DIAGNOSIS — F329 Major depressive disorder, single episode, unspecified: Secondary | ICD-10-CM | POA: Insufficient documentation

## 2019-10-05 LAB — COMPREHENSIVE METABOLIC PANEL
ALT: 21 U/L (ref 0–44)
AST: 33 U/L (ref 15–41)
Albumin: 4 g/dL (ref 3.5–5.0)
Alkaline Phosphatase: 85 U/L (ref 38–126)
Anion gap: 11 (ref 5–15)
BUN: 13 mg/dL (ref 6–20)
CO2: 27 mmol/L (ref 22–32)
Calcium: 10.1 mg/dL (ref 8.9–10.3)
Chloride: 103 mmol/L (ref 98–111)
Creatinine, Ser: 1.01 mg/dL (ref 0.61–1.24)
GFR calc Af Amer: >60 mL/min (ref >60)
GFR calc non Af Amer: >60 mL/min (ref >60)
Glucose, Bld: 77 mg/dL (ref 70–99)
Potassium: 4.9 mmol/L (ref 3.5–5.1)
Sodium: 141 mmol/L (ref 135–145)
Total Bilirubin: 0.7 mg/dL (ref 0.3–1.2)
Total Protein: 7.7 g/dL (ref 6.5–8.1)

## 2019-10-05 LAB — RAPID URINE DRUG SCREEN, HOSP PERFORMED
Amphetamines: NOT DETECTED
Barbiturates: NOT DETECTED
Benzodiazepines: POSITIVE — AB
Cocaine: NOT DETECTED
Opiates: NOT DETECTED
Tetrahydrocannabinol: NOT DETECTED

## 2019-10-05 LAB — RESPIRATORY PANEL BY RT PCR (FLU A&B, COVID)
Influenza A by PCR: NEGATIVE
Influenza B by PCR: NEGATIVE
SARS Coronavirus 2 by RT PCR: NEGATIVE

## 2019-10-05 LAB — VALPROIC ACID LEVEL: Valproic Acid Lvl: 97 ug/mL (ref 50.0–100.0)

## 2019-10-05 LAB — ACETAMINOPHEN LEVEL: Acetaminophen (Tylenol), Serum: <10 ug/mL — ABNORMAL LOW (ref 10–30)

## 2019-10-05 LAB — SALICYLATE LEVEL: Salicylate Lvl: <7 mg/dL — ABNORMAL LOW (ref 7.0–30.0)

## 2019-10-05 LAB — ETHANOL: Alcohol, Ethyl (B): <10 mg/dL (ref <10)

## 2019-10-05 MED ORDER — DIVALPROEX SODIUM ER 500 MG PO TB24
1500.0000 mg | ORAL_TABLET | Freq: Two times a day (BID) | ORAL | Status: DC
  Administered 2019-10-05 – 2019-10-06 (×3): 1500 mg via ORAL
  Filled 2019-10-05 (×3): qty 3

## 2019-10-05 MED ORDER — ACETAMINOPHEN 325 MG PO TABS: 650.0000 mg | ORAL_TABLET | ORAL | Status: DC | PRN

## 2019-10-05 MED ORDER — LEVOTHYROXINE SODIUM 50 MCG PO TABS
62.5000 ug | ORAL_TABLET | Freq: Every day | ORAL | Status: DC
  Administered 2019-10-05 – 2019-10-06 (×2): 62.5 ug via ORAL
  Filled 2019-10-05 (×2): qty 1

## 2019-10-05 MED ORDER — ONDANSETRON HCL 4 MG PO TABS: 4.0000 mg | ORAL_TABLET | Freq: Three times a day (TID) | ORAL | Status: DC | PRN

## 2019-10-05 MED ORDER — SODIUM CHLORIDE 0.9 % IV BOLUS
1000.0000 mL | Freq: Once | INTRAVENOUS | Status: AC
  Administered 2019-10-05: 1000 mL via INTRAVENOUS

## 2019-10-05 MED ORDER — RISPERIDONE 0.5 MG PO TABS
0.5000 mg | ORAL_TABLET | Freq: Two times a day (BID) | ORAL | Status: DC
  Administered 2019-10-05 – 2019-10-06 (×2): 0.5 mg via ORAL
  Filled 2019-10-05 (×2): qty 1

## 2019-10-05 NOTE — ED Notes (Signed)
Pt setting up Wii Gaming system in room.

## 2019-10-05 NOTE — ED Provider Notes (Signed)
Emergency Department Provider Note   I have reviewed the triage vital signs and the nursing notes.   HISTORY  Chief Complaint Seizures and Rash   HPI David [name] is a 19 y.o. male with PMH of autism and seizure disorder presents to the ED for evaluation after experiencing 2 seizures.  At the first was apparently witnessed by bystanders and EMS was called.  When EMS arrived on scene the patient had a second episode.  They describe forcefully holding his eyes closed and suspected some pseudoseizure-like activity.  He has had multiple EEGs in the past with no seizure activity but is on Keppra and Depakote.  The patient ultimately received 5 mg of IM Versed.  He is drowsy and unable to provide much history at this time.  Level 5 caveat applies. Unknown if patient has been compliant with meds recently.   Level 5 caveat: Somnolence. ? Post-ictal vs meds given by EMS.   Past Medical History:  Diagnosis Date  . Autism   . Intellectual disability    Moderate  . Limbic encephalitis   . Seizures ([place])   . Speech delay   . Thyroid disease     Patient Active Problem List   Diagnosis Date Noted  . 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 Seizure disorder ([place]) 04/24/2018  . Seizure ([place]) 04/24/2017  . Acne 11/03/2016  . Depression 11/03/2016  . Intellectual disability 11/03/2016  . 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 Epilepsy ([place]) 11/03/2016  . 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 Hypothyroidism 04/02/2008  . RHINITIS, ALLERGIC 07/26/2006    Past Surgical History:  Procedure Laterality Date  . BRAIN SURGERY     Brain biopsy  . GASTROSTOMY TUBE REVISION      Allergies Penicillins  No family history on file.  Social History Social History   Tobacco Use  . Smoking status: Passive Smoke Exposure - Never Smoker  . Smokeless tobacco: Never Used  Substance Use Topics  . Alcohol use: No  . Drug use: No     Review of Systems  Level 5 caveat: AMS   ____________________________________________   PHYSICAL EXAM:  VITAL SIGNS: ED Triage Vitals  Enc Vitals Group     BP 10/05/19 1212 110/62     Pulse Rate 10/05/19 1212 71     Resp 10/05/19 1212 20     Temp 10/05/19 1212 98.5 F (36.9 C)     Temp Source 10/05/19 1212 Axillary     SpO2 10/05/19 1207 98 %   Constitutional: Drowsy. Arouses to gentle rubbing on the chest but then back to sleep.  Eyes: Conjunctivae are normal. PERRL.  Head: Atraumatic. Nose: No congestion/rhinnorhea. Mouth/Throat: Mucous membranes are moist.  Neck: No stridor. Cardiovascular: Normal rate, regular rhythm. Good peripheral circulation. Grossly normal heart sounds.   Respiratory: Normal respiratory effort.  No retractions. Lungs CTAB. Gastrointestinal: Soft and nontender. No distention.  Musculoskeletal: No lower extremity tenderness nor edema. No gross deformities of extremities.  Normal passive range of motion of upper and lower extremities. Neurologic: Spontaneously moving all extremities. Drowsy but awakens to touch. Unable to fully assess speech. No facial asymmetry.  Skin:  Skin is warm, dry and intact. No rash noted.  ____________________________________________   LABS (all labs ordered are listed, but only abnormal results are displayed)  Labs Reviewed  ACETAMINOPHEN LEVEL - Abnormal; Notable for the following components:      Result Value   Acetaminophen (Tylenol), Serum <  10 (*)    All other components within normal limits  SALICYLATE LEVEL - Abnormal; Notable for the following components:   Salicylate Lvl <7.0 (*)    All other components within normal limits  RESPIRATORY PANEL BY RT PCR (FLU A&B, COVID)  COMPREHENSIVE METABOLIC PANEL  ETHANOL  VALPROIC ACID LEVEL  CBC WITH DIFFERENTIAL/PLATELET  RAPID URINE DRUG SCREEN, HOSP PERFORMED   ____________________________________________  EKG  Rate: 80 PR: 140 QTc: 383  Sinus rhythm.  LVH by voltage. Non-specific ST changes similar to prior EKGs likely early repolarization. No STEMI  ____________________________________________  RADIOLOGY  None  ____________________________________________   PROCEDURES  Procedure(s) performed:   Procedures  None  ____________________________________________   INITIAL IMPRESSION / ASSESSMENT AND PLAN / ED COURSE  Pertinent labs & imaging results that were available during my care of the patient were reviewed by me and considered in my medical decision making (see chart for details).   Patient presents to the ED with breakthrough seizure vs pseudoseizure.  Patient has had multiple EEGs without seizure activity in the past.  He has had several ED presentations for similar type symptoms.  He is drowsy here but did receive Versed prior to arrival.  Could also be post ictal.  While the second event was witnessed by EMS and described to be more nonepileptiform in their estimation the initial seizure does not have a good description.  I do not see any evidence of head trauma to prompt CT head. Will given IVF and reassess after labs.   Patient's lab work reviewed and coming back relatively unremarkable.  The nurse spoke with the patient's case manager.  She reports increased aggression over the past week especially.  She was apparently on the phone with the patient shortly before his seizure activity and describes him as a lot more aggressive and agitated.  He made a statement to her regarding wanting to kill himself.  He did not verbalize a plan.  Shortly afterwards the paramedics were called for the seizure witnessed by bystanders.  With this, I have completed IVC paperwork and filed that.  Will ask psych to evaluate once medically clear.   02:58 PM  Patient is more awake and alert now.  He reports taking his seizure medicines and denies any alcohol or drug use.  He denies any SI to me currently.  Will have behavioral health see the patient  given the concerns from his case manager with increased aggression and suicidal ideation expressed to her on the phone.  No changes to seizure meds at this time.   Patient is medically cleared.  Home meds restarted.  ____________________________________________  FINAL CLINICAL IMPRESSION(S) / ED DIAGNOSES  Final diagnoses:  Seizure-like activity (HCC)  Suicidal ideation    MEDICATIONS GIVEN DURING THIS VISIT:  Medications  acetaminophen (TYLENOL) tablet 650 mg (has no administration in time range)  ondansetron (ZOFRAN) tablet 4 mg (has no administration in time range)  divalproex (DEPAKOTE ER) 24 hr tablet 1,500 mg (has no administration in time range)  levothyroxine (SYNTHROID) tablet 62.5 mcg (has no administration in time range)  sodium chloride 0.9 % bolus 1,000 mL (1,000 mLs Intravenous New Bag/Given 10/05/19 1242)    Note:  This document was prepared using Dragon voice recognition software and may include unintentional dictation errors.  Alona Bene, MD, [facility] Emergency Medicine    Jefferie Holston, Arlyss Repress, MD 10/05/19 [phone]

## 2019-10-05 NOTE — Progress Notes (Signed)
Patient does not meet criteria for psychiatric admission. His case worker is concerned about his aggressive behaviors, punching walls, and not being on any medications. Started Risperdal 0.5 mg BID and if tolerates it, will discharge tomorrow.  Low dose started as to not trigger a seizure and will be monitored, takes large dose of Depakote for seizure disorder.    Nanine Means, PMHNP

## 2019-10-05 NOTE — BH Assessment (Signed)
Tele Assessment Note   Patient Name: David [name] MRN: [id number] Referring Physician: Alona Bene, MD Location of Patient: [facility] Location of Provider: Behavioral Health TTS Department  David [name] is a 19 y.o. male who presented to [facility] on voluntary basis (trasported by EMS) after having experienced what was described as a pseudoseizure at the shelter where he lives.  Pt lives in [place] and is unemployed.  He has a legal guardian -- Arnetha Courser [phone]).  Pt is not followed by an outpatient provider.  Pt was last assessed by TTS on 09/27/19.  At that time, Pt was assessed due to aggression.  Per report, Pt experienced two seizures following conflict at the shelter.   Medical examination suggested pseudoseizure.  Pt admitted that he got angry at the shelter today due to conflict with another guest there.  Pt denied suicidal ideation, homicidal ideation, hallucination, self-injurious behavior, and substance use..  Pt described his mood as sad because he misses his father -- per notes, is father (who was his caretaker) died in 11/22/2018.   Pt stated that he would like to have a therapist.  Pt denied any current psychiatric care.  Chartered loss adjuster spoke with Pt's Leisure centre manager.  She expressed concern -- she stated that Pt is becoming increasingly aggressive at the shelter, that he will hit his head against the wall and throw things when upset, and that today he endorsed suicidal ideation to her.  She requested a more detailed examination.  It is her hope that Pt can begin medication.  During assessment, Pt presented as alert and oriented.  He had fair eye contact and was cooperative.  Pt was dressed in scrubs, and he appeared appropriately groomed.  Pt's mood was sad.  Affect was blunted.  Pt's speech was slow and soft.  Thought processes were slow.  Thought content was logical and goal-oriented.  There was no evidence of delusion.  Pt's memory and concentration were fair.  Insight,  judgment, and impulse control were poor.  Consulted with Molli Knock, NP, who determined that Pt is to remain in the ED, begin medication, AM psych eval with plan to discharge.  Diagnosis: Autism Spectrum Disorder; r/o MDD  Past Medical History:  Past Medical History:  Diagnosis Date  . Autism   . Intellectual disability    Moderate  . Limbic encephalitis   . Seizures (HCC)   . Speech delay   . Thyroid disease     Past Surgical History:  Procedure Laterality Date  . BRAIN SURGERY     Brain biopsy  . GASTROSTOMY TUBE REVISION      Family History: No family history on file.  Social History:  reports that he is a non-smoker but has been exposed to tobacco smoke. He has never used smokeless tobacco. He reports that he does not drink alcohol or use drugs.  Additional Social History:  Alcohol / Drug Use Pain Medications: See MAR Prescriptions: See MAR Over the Counter: See MAR History of alcohol / drug use?: No history of alcohol / drug abuse  CIWA: CIWA-Ar BP: (!) 113/54 Pulse Rate: 76 COWS:    Allergies:  Allergies  Allergen Reactions  . Penicillins Hives    DID THE REACTION INVOLVE: Swelling of the face/tongue/throat, SOB, or low BP? Yes Sudden or severe rash/hives, skin peeling, or the inside of the mouth or nose? No Did it require medical treatment? No When did it last happen? Was an infant If all above answers are "NO", may proceed  with cephalosporin use.    Home Medications: (Not in a hospital admission)   OB/GYN Status:  No LMP for male patient.  General Assessment Data Location of Assessment: [facility] TTS Assessment: In system Is this a Tele or Face-to-Face Assessment?: Tele Assessment Is this an Initial Assessment or a Re-assessment for this encounter?: Initial Assessment Patient Accompanied by:: N/A Language Other than English: No Living Arrangements: Homeless/Shelter What gender do you identify as?: Male Marital status: Single Living Arrangements:  Other (Comment)(Shelter) Can pt return to current living arrangement?: Yes Admission Status: Voluntary Is patient capable of signing voluntary admission?: Yes Referral Source: Other Insurance type: [place] [facility]     Crisis Care Plan Living Arrangements: Other (Comment)(Shelter) Legal Guardian: Other:(DSS Child psychotherapist) Name of Psychiatrist: None currently Name of Therapist: None currently  Education Status Is patient currently in school?: No Is the patient employed, unemployed or receiving disability?: Receiving disability income  Risk to self with the past 6 months Suicidal Ideation: Yes-Currently Present(Expressed to guardian; see notes) Has patient been a risk to self within the past 6 months prior to admission? : No Suicidal Intent: No Has patient had any suicidal intent within the past 6 months prior to admission? : No Is patient at risk for suicide?: No Suicidal Plan?: No Has patient had any suicidal plan within the past 6 months prior to admission? : No Access to Means: No What has been your use of drugs/alcohol within the last 12 months?: Denied Previous Attempts/Gestures: No Intentional Self Injurious Behavior: None Family Suicide History: No Recent stressful life event(s): Turmoil (Comment) Persecutory voices/beliefs?: No Depression: Yes Depression Symptoms: Despondent, Feeling angry/irritable, Isolating Substance abuse history and/or treatment for substance abuse?: No Suicide prevention information given to non-admitted patients: Not applicable  Risk to Others within the past 6 months Homicidal Ideation: No Does patient have any lifetime risk of violence toward others beyond the six months prior to admission? : No Thoughts of Harm to Others: No Current Homicidal Intent: No Current Homicidal Plan: No Access to Homicidal Means: No History of harm to others?: No Assessment of Violence: None Noted Does patient have access to weapons?: No Criminal Charges Pending?:  No Does patient have a court date: No Is patient on probation?: No  Psychosis Hallucinations: None noted Delusions: None noted  Mental Status Report Appearance/Hygiene: Unremarkable, In scrubs Eye Contact: Fair Motor Activity: Freedom of movement, Unremarkable Speech: Slow Level of Consciousness: Alert Mood: Sad Affect: Blunted Anxiety Level: None Thought Processes: Coherent, Relevant Judgement: Impaired Orientation: Person, Place, Time, Situation Obsessive Compulsive Thoughts/Behaviors: None  Cognitive Functioning Concentration: Fair Memory: Recent Intact, Remote Intact Is patient IDD: No(Per history) Level of Function: Moderate(Per history) Is IQ score available?: No Insight: Poor Impulse Control: Poor Appetite: Good Have you had any weight changes? : No Change Sleep: Decreased Total Hours of Sleep: (Mixed)  ADLScreening [facility] Assessment Services) Patient's cognitive ability adequate to safely complete daily activities?: Yes Patient able to express need for assistance with ADLs?: Yes Independently performs ADLs?: Yes (appropriate for developmental age)     Prior Outpatient Therapy Prior Outpatient Therapy: (Per guardian, went to [facility] once)  ADL Screening (condition at time of admission) Patient's cognitive ability adequate to safely complete daily activities?: Yes Is the patient deaf or have difficulty hearing?: No Does the patient have difficulty seeing, even when wearing glasses/contacts?: No Does the patient have difficulty concentrating, remembering, or making decisions?: No Patient able to express need for assistance with ADLs?: Yes Does the patient have difficulty dressing  or bathing?: No Independently performs ADLs?: Yes (appropriate for developmental age) Does the patient have difficulty walking or climbing stairs?: No Weakness of Legs: None Weakness of Arms/Hands: None  Home Assistive Devices/Equipment Home Assistive Devices/Equipment:  None  Therapy Consults (therapy consults require a physician order) PT Evaluation Needed: No OT Evalulation Needed: No SLP Evaluation Needed: No Abuse/Neglect Assessment (Assessment to be complete while patient is alone) Abuse/Neglect Assessment Can Be Completed: Yes Physical Abuse: Denies Verbal Abuse: Denies Sexual Abuse: Denies Exploitation of patient/patient's resources: Denies Self-Neglect: Denies Values / Beliefs Cultural Requests During Hospitalization: None Spiritual Requests During Hospitalization: None Consults Spiritual Care Consult Needed: No Transition of Care Team Consult Needed: No Advance Directives (For Healthcare) Does Patient Have a Medical Advance Directive?: No Would patient like information on creating a medical advance directive?: No - Patient declined          Disposition:  Disposition Initial Assessment Completed for this Encounter: Yes Patient referred to: Other (Comment)(J. Reita Cliche, NP to begin meds, AM psych eval)  This service was provided via telemedicine using a 2-way, interactive audio and video technology.  Names of all persons participating in this telemedicine service and their role in this encounter. Name: David [name] Role: Patient  Name: David [name] Role: DSS          David [name] 10/05/2019 4:24 PM

## 2019-10-05 NOTE — ED Notes (Addendum)
Pt arrived to Rm [facility] via stretcher - Pt ambulated to bed - pt changed out of gown into burgundy scrubs and socks. Pt's belongings were inventoried by [employer] - placed in Mobile # 1 - 2 labeled belongings bags - Valuables w/Security. Pt has snack at bedside. Pt asking to play video games. Advised pt this RN will attempt to locate games today but it may be tomorrow. Pt voiced understanding. No Sitter available per Staffing Office for pt - Jamie, Consulting civil engineer, aware. Pt being monitored by staff.

## 2019-10-05 NOTE — ED Notes (Signed)
DSS Arnetha Courser [phone]

## 2019-10-05 NOTE — ED Notes (Addendum)
Pt asking to play on the computer. Advised unable to do so. Denies SI/HI - acknowledges things have been going well at "group home" and came to ED d/t "I had a seizure". Per chart, pt is living in homeless shelter.

## 2019-10-05 NOTE — ED Triage Notes (Signed)
Per [facility],  Pt had a seizure witnessed by bystanders. Pt had witnessed seizure by EMS. EMS reports unsure if some activity was pseudo seizure as pt was forcefully holding his eyes closed. Pt received 5 mg IM Versed. Pt lethargic, opens eyes to verbal and falls back asleep immediately, unable to answer any orientation questions.

## 2019-10-05 NOTE — ED Notes (Signed)
IVC papers served - Copy faxed to [facility] - Copy sent to Medical Records - Original placed in folder for Magistrate - ALL 3 sets on clipboard.  

## 2019-10-05 NOTE — ED Notes (Signed)
[employer] to request LEO to come serve IVC paperwork correctly. Pt playing Wii.

## 2019-10-06 MED ORDER — RISPERIDONE 0.5 MG PO TABS
0.5000 mg | ORAL_TABLET | Freq: Two times a day (BID) | ORAL | 0 refills | Status: DC
Start: 2019-10-06 — End: 2019-10-28

## 2019-10-06 NOTE — Discharge Instructions (Addendum)
Start taking the risperdal.  Follow up with your mental health provider to be rechecked in the next week or two.  If you do not have a mental health provider, contact [facility] at your earliest opportunity to schedule in intake appointment:       [facility]      [address]      [place], [place] [address]      [phone]      Crisis number: [phone]

## 2019-10-06 NOTE — ED Notes (Signed)
Telepsych being performed. 

## 2019-10-06 NOTE — ED Notes (Signed)
ALL belongings - 2 labeled belongings and 1 valuables envelope - returned to pt - Pt signed verified all items present. Pt escorted to lobby to Arnetha Courser - DSS Legal Guardian. D/C instructions discussed and given to Aram Beecham - questions answered to satisfaction - Voiced understanding pt was started on new med and rx will need to be picked up at pharmacy.

## 2019-10-06 NOTE — BH Assessment (Signed)
[facility] Assessment Progress Note  Per David Magnuson, NP, this pt does not require psychiatric hospitalization at this time.  Pt is psychiatrically cleared.  Discharge instructions advise pt to follow up with current outpatient provider, or to contact [facility] at pt's earliest opportunity to schedule an intake appointment.  Per David [name], pt's mother/legal guardian has been contacted.  She plans to present at [facility] at 12:00 to pick pt up.  Pt's nurse has been notified.  David Canning, MA Triage Specialist [phone]

## 2019-10-06 NOTE — ED Notes (Signed)
Patient denies pain and is resting comfortably.  

## 2019-10-06 NOTE — Progress Notes (Addendum)
Patient ID: David [name], male   DOB: [birth year], 19 y.o.   MRN: [id number]   Psychiatric reassessment   In brief; [name] is a 19 y.o. male who presented to [facility] on voluntary basis (trasported by EMS) after having experienced what was described as a pseudoseizure at the shelter where he lives. Patient legal guardian is Arnetha Courser [phone]), social worker. Patient guardian reported concerns of increased aggressive behaviors. Patient has a diagnosis of Autism.   During this evaluation, patient was alert and oriented, calm and cooperative. He denied SI, HI or AVH. He mostly denied everything including aggressive behaviors and becoming so upset that he hir his head against the wall although this was reported by Child psychotherapist. Patient was by [facility] provider yesterday and it was determined that he did not meet criteria for psychiatric admission although Risperdal 0.5 mg BID was initiated for his agression. Patient seems to be tolerating the medication well. We discussed some side effects which he denied. He has had not reported events of aggression while in the ED.   I spoke to patients guardian and updated her on patients progress and medication tolerance. I advised that he was cleared for discharge. I inquired about outpatient psychiatric services and per guardian, she has contacted [facility] and is awaiting a phone call back to schedule an  appointment for patient to start therapy. She stated that patient will have medication management through [facility] as well. She voiced no concerns with discharge.  Disposition: Patient is tolerating Risperdal 0.5 mg BID well with no reported side effects. He denies SI, HI or psychosis. At this time, there is no No evidence of imminent risk to self or others at present. He continues  to not meet criteria for psychiatric inpatient admission and is psychiatrically cleared. Patients guardian  Arnetha Courser [phone]) has been updated.,. She stated that she  could pick patient up from the ED today after 12:00 noon. She was advised that patient should continue the medication which was started. She is to follow-up with [facility] for outpatient psychiatric services to be established.   ED updated on disposition.

## 2019-10-06 NOTE — ED Notes (Signed)
IVC paperwork - Rescinded by Dr Roselyn Bering - Copy faxed to [facility] of Court - Copy sent to Medical Records - Original placed in folder for [employer].

## 2019-10-06 NOTE — ED Provider Notes (Signed)
Pt assessed by Psych this am.  Deemed stable for discharge at this time.   Will dc home on risperdal which he has been taking in the ED with good effect.  Vitals normal.  Stable for dc   Linwood Dibbles, MD 10/06/19 1043

## 2019-10-09 ENCOUNTER — Ambulatory Visit: Payer: Medicaid Other | Attending: Internal Medicine

## 2019-10-09 DIAGNOSIS — Z23 Encounter for immunization: Secondary | ICD-10-CM

## 2019-10-09 NOTE — Progress Notes (Signed)
   Covid-19 Vaccination Clinic  Name:  David [name]    MRN: [id number] DOB: [birth year]  10/09/2019  [name] was observed post Covid-19 immunization for 15 minutes without incident. He was provided with Vaccine Information Sheet and instruction to access the V-Safe system.   [name] was instructed to call 911 with any severe reactions post vaccine: Marland Kitchen Difficulty breathing  . Swelling of face and throat  . A fast heartbeat  . A bad rash all over body  . Dizziness and weakness   Immunizations Administered    Name Date Dose VIS Date Route   Moderna COVID-19 Vaccine 10/09/2019 10:35 AM 0.5 mL 04/2019 Intramuscular   Manufacturer: Moderna   Lot: [id number]   NDC: 83074-600-29

## 2019-10-10 ENCOUNTER — Other Ambulatory Visit: Payer: Self-pay

## 2019-10-10 ENCOUNTER — Emergency Department (HOSPITAL_COMMUNITY)
Admission: EM | Admit: 2019-10-10 | Discharge: 2019-10-11 | Disposition: A | Discharge status: Home / Self Care | Payer: Medicaid Other | Attending: Emergency Medicine | Admitting: Emergency Medicine

## 2019-10-10 ENCOUNTER — Emergency Department (HOSPITAL_COMMUNITY): Payer: Medicaid Other

## 2019-10-10 ENCOUNTER — Encounter (HOSPITAL_COMMUNITY): Payer: Self-pay

## 2019-10-10 DIAGNOSIS — F840 Autistic disorder: Secondary | ICD-10-CM | POA: Insufficient documentation

## 2019-10-10 DIAGNOSIS — Y929 Unspecified place or not applicable: Secondary | ICD-10-CM | POA: Diagnosis not present

## 2019-10-10 DIAGNOSIS — Y939 Activity, unspecified: Secondary | ICD-10-CM | POA: Insufficient documentation

## 2019-10-10 DIAGNOSIS — Z7722 Contact with and (suspected) exposure to environmental tobacco smoke (acute) (chronic): Secondary | ICD-10-CM | POA: Insufficient documentation

## 2019-10-10 DIAGNOSIS — Z79899 Other long term (current) drug therapy: Secondary | ICD-10-CM | POA: Insufficient documentation

## 2019-10-10 DIAGNOSIS — E039 Hypothyroidism, unspecified: Secondary | ICD-10-CM | POA: Diagnosis not present

## 2019-10-10 DIAGNOSIS — S4991XA Unspecified injury of right shoulder and upper arm, initial encounter: Secondary | ICD-10-CM | POA: Diagnosis not present

## 2019-10-10 DIAGNOSIS — R569 Unspecified convulsions: Secondary | ICD-10-CM | POA: Insufficient documentation

## 2019-10-10 DIAGNOSIS — X58XXXA Exposure to other specified factors, initial encounter: Secondary | ICD-10-CM | POA: Insufficient documentation

## 2019-10-10 DIAGNOSIS — Y999 Unspecified external cause status: Secondary | ICD-10-CM | POA: Diagnosis not present

## 2019-10-10 LAB — CBG MONITORING, ED
Glucose-Capillary: 55 mg/dL — ABNORMAL LOW (ref 70–99)
Glucose-Capillary: 78 mg/dL (ref 70–99)
Glucose-Capillary: 82 mg/dL (ref 70–99)
Glucose-Capillary: 78 mg/dL (ref 70–99)

## 2019-10-10 MED ORDER — LEVOTHYROXINE SODIUM 50 MCG PO TABS: 62.5000 ug | ORAL_TABLET | Freq: Every day | ORAL | Status: DC

## 2019-10-10 MED ORDER — RISPERIDONE 0.5 MG PO TABS
0.5000 mg | ORAL_TABLET | Freq: Two times a day (BID) | ORAL | Status: DC
  Administered 2019-10-10: 0.5 mg via ORAL
  Filled 2019-10-10: qty 1

## 2019-10-10 MED ORDER — DEXTROSE 50 % IV SOLN: 1.0000 | Freq: Once | INTRAVENOUS | Status: AC

## 2019-10-10 MED ORDER — VITAMIN D 25 MCG (1000 UNIT) PO TABS: 1000.0000 [IU] | ORAL_TABLET | Freq: Every day | ORAL | Status: DC

## 2019-10-10 MED ORDER — DIVALPROEX SODIUM ER 500 MG PO TB24
1500.0000 mg | ORAL_TABLET | Freq: Two times a day (BID) | ORAL | Status: DC
  Administered 2019-10-10: 1500 mg via ORAL
  Filled 2019-10-10: qty 3

## 2019-10-10 MED ORDER — DEXTROSE 50 % IV SOLN
INTRAVENOUS | Status: AC
  Administered 2019-10-10: 50 mL via INTRAVENOUS
  Filled 2019-10-10: qty 50

## 2019-10-10 NOTE — ED Notes (Signed)
Called and updated pt's legal guardian, Aram Beecham; Aram Beecham requests that she be notified when pt is up for discharge; Aram Beecham to be pt's ride home

## 2019-10-10 NOTE — ED Notes (Signed)
Pt found outside of room fully dressed. Pt expressed desire to leave. Pt redirected to room and sandwiches given. Provider aware

## 2019-10-10 NOTE — ED Provider Notes (Signed)
[name] EMERGENCY DEPARTMENT Provider Note   CSN: [id number] Arrival date & time: 10/10/19  1425     History Chief Complaint  Patient presents with  . Seizures    David [name] is a 19 y.o. male.  The history is provided by the EMS personnel and the patient. The history is limited by the condition of the patient.  Seizures Seizure activity on arrival: yes   Seizure type:  Grand mal Preceding symptoms comment:  Unknown Initial focality:  Unable to specify Episode characteristics: generalized shaking and unresponsiveness   Postictal symptoms: confusion and somnolence   Return to baseline: no (pt recieved versed prior to arrival and has been sleeping since)   Severity:  Moderate Timing:  Once Number of seizures this episode:  1 Progression:  Resolved Context comment:  Recent medication changes Recent head injury:  No recent head injuries PTA treatment:  Diazepam History of seizures: yes   Severity:  Moderate Seizure control level:  Poorly controlled Current therapy:  Valproic acid Compliance with current therapy:  Good      Past Medical History:  Diagnosis Date  . Autism   . Intellectual disability    Moderate  . Limbic encephalitis   . Seizures (HCC)   . Speech delay   . Thyroid disease     Patient Active Problem List   Diagnosis Date Noted  . 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 Seizure disorder (HCC) 04/24/2018  . Seizure (HCC) 04/24/2017  . Acne 11/03/2016  . Depression 11/03/2016  . Intellectual disability 11/03/2016  . 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 Hypothyroidism 04/02/2008  . RHINITIS, ALLERGIC 07/26/2006    Past Surgical History:  Procedure Laterality Date  . BRAIN SURGERY     Brain biopsy  . GASTROSTOMY TUBE REVISION         History reviewed. No pertinent family  history.  Social History   Tobacco Use  . Smoking status: Passive Smoke Exposure - Never Smoker  . Smokeless tobacco: Never Used  Substance Use Topics  . Alcohol use: No  . Drug use: No    Home Medications Prior to Admission medications   Medication Sig Start Date End Date Taking? Authorizing Provider  cholecalciferol (VITAMIN D3) 25 MCG (1000 UNIT) tablet Take 1,000 Units by mouth daily.    [provider]  divalproex (DEPAKOTE ER) 500 MG 24 hr tablet Take 1,500 mg by mouth 2 (two) times daily. 09/25/19   [provider]  Eslicarbazepine Acetate 800 MG TABS Take 800 mg by mouth daily with supper. Patient not taking: Reported on 09/27/2019 06/16/18   Charlestine Night, PA-C  levETIRAcetam (KEPPRA) 1000 MG/100ML SOLN Inject 100 mLs (1,000 mg total) into the vein 2 (two) times daily. Patient not taking: Reported on 06/07/2018 04/24/18   Maretta Bees, MD  levothyroxine (SYNTHROID) 125 MCG tablet Take 0.5 tablets (62.5 mcg total) by mouth daily. 09/29/19   Charm Rings, NP  risperiDONE (RISPERDAL) 0.5 MG tablet Take 1 tablet (0.5 mg total) by mouth 2 (two) times daily. 10/06/19   Linwood Dibbles, MD    Allergies    Penicillins  Review of Systems   Review of Systems  Neurological: Positive for seizures.  All other systems reviewed and are negative.   Physical Exam Updated Vital Signs BP (!) 109/55   Pulse 88   Temp 98.5 F (36.9 C) (  Axillary)   Resp 20   SpO2 100%   Physical Exam Vitals and nursing note reviewed.  Constitutional:      General: He is not in acute distress.    Appearance: Normal appearance. He is well-developed and normal weight.     Comments: Sleepy but arousable by voice  HENT:     Head: Normocephalic and atraumatic.     Mouth/Throat:     Comments: No bite marks on tongue Eyes:     Conjunctiva/sclera: Conjunctivae normal.     Pupils: Pupils are equal, round, and reactive to light.  Cardiovascular:     Rate and Rhythm: Normal rate  and regular rhythm.     Pulses: Normal pulses.     Heart sounds: No murmur.  Pulmonary:     Effort: Pulmonary effort is normal. No respiratory distress.     Breath sounds: Normal breath sounds. No wheezing or rales.  Abdominal:     General: There is no distension.     Palpations: Abdomen is soft.     Tenderness: There is no abdominal tenderness. There is no guarding or rebound.  Musculoskeletal:        General: No tenderness. Normal range of motion.     Cervical back: Normal range of motion and neck supple.  Skin:    General: Skin is warm and dry.     Findings: No erythema or rash.  Neurological:     General: No focal deficit present.     Comments: Able to follow commands briefly but then goes back to sleep.  Moving all ext without difficulty  Psychiatric:     Comments: sleeping     ED Results / Procedures / Treatments   Labs (all labs ordered are listed, but only abnormal results are displayed) Labs Reviewed  CBG MONITORING, ED - Abnormal; Notable for the following components:      Result Value   Glucose-Capillary 55 (*)    All other components within normal limits  CBG MONITORING, ED    EKG EKG Interpretation  Date/Time:  Friday Oct 10 2019 14:29:56 EDT Ventricular Rate:  95 PR Interval:    QRS Duration: 84 QT Interval:  310 QTC Calculation: 390 R Axis:   65 Text Interpretation: Sinus rhythm Normal ECG No significant change since last tracing Confirmed by Gwyneth Sprout ([id number]) on 10/10/2019 2:32:34 PM   Radiology No results found.  Procedures Procedures (including critical care time)  Medications Ordered in ED Medications  dextrose 50 % solution 50 mL (50 mLs Intravenous Given 10/10/19 1442)    ED Course  I have reviewed the triage vital signs and the nursing notes.  Pertinent labs & imaging results that were available during my care of the patient were reviewed by me and considered in my medical decision making (see chart for details).    MDM  Rules/Calculators/A&P                      Patient is a 19 year old male with history of seizure disorder who had a witnessed seizure today at the homeless shelter.  Patient was given 5 mg of Versed and has been very sleepy here.  He has no notable neuro deficit.  Patient's labs from 5 days ago were normal with a therapeutic Depakote level.  Patient's initial blood sugar was 55 but after D50 his blood sugar has been normal.  Patient is still sleepy and will need to wake up before he is able to be discharged. Final  Clinical Impression(s) / ED Diagnoses Final diagnoses:  None    Rx / DC Orders ED Discharge Orders    None       Blanchie Dessert, MD 10/10/19 1637

## 2019-10-10 NOTE — ED Notes (Signed)
EDP notifed of BS.

## 2019-10-10 NOTE — ED Provider Notes (Signed)
7:15 PM-patient is alert and watching TV.  Checkout from Dr. Revonda [name] evaluate for recovery from Versed sedation and seizure.  Patient with history of seizure, and adjustment disorder.  He is apparently homeless.  He takes Keppra for seizures as well as Depakote.   Patient Vitals for the past 24 hrs:  BP Temp Temp src Pulse Resp SpO2  10/10/19 1830 (!) 130/59 -- -- 83 (!) 24 99 %  10/10/19 1800 112/68 -- -- 76 16 97 %  10/10/19 1730 122/61 -- -- 66 (!) 21 99 %  10/10/19 1615 (!) 109/55 -- -- 84 (!) 21 99 %  10/10/19 1545 (!) 111/50 -- -- 70 20 100 %  10/10/19 1515 (!) 109/55 -- -- 88 20 100 %  10/10/19 1431 113/74 98.5 F (36.9 C) Axillary 88 17 99 %    8:09 PM Reevaluation with update and discussion. After initial assessment and treatment, an updated evaluation reveals he was able to ambulate easily but complained of right shoulder pain, and had tenderness to palpation of the right shoulder.  Imaging ordered. David [name]   9:14 PM-he is alert, conversant he requests food and "a Sprite."  He states he has pain only in the right shoulder.  At this time he is able to elevate the shoulder, and hold it above his head for 10 seconds without difficulty.  There is no shoulder deformity.  His neurovascular intact distally in the right hand.  Findings discussed with the patient and all questions were answered.  He states he will be going to the shelter tonight.   Medical Decision Making:  This patient is presenting for evaluation of seizure, which does require a range of treatment options, and is a complaint that involves a moderate risk of morbidity and mortality. The differential diagnoses include postictal state, intoxication, altered mental state. I decided to review old records, and in summary patient with history of seizures adjustment disorder, presenting after evaluation and treatment by EMS with sedation by Versed, 5 mg.  I did not require additional historical information from  anyone.  Clinical Laboratory Tests Ordered, included CBG. Review indicates condition though improved after eating. Radiologic Tests Ordered, included right shoulder x-ray.  I independently Visualized: Radiographs images, which show normal bony structures, no dislocation  Cardiac Monitor Tracing which shows normal sinus rhythm    Critical Interventions-clinical evaluation, laboratory testing, imaging right shoulder  After These Interventions, the Patient was reevaluated and was found improved and comfortable.  Seizure without recurrence, recently evaluated in the ED for same.  He is taking his usual medications per his report.  No indication for changing medications at this time.  He will be referred to PCP and neurology for ongoing management.  CRITICAL CARE-no Performed by: David [name]     Nursing Notes Reviewed/ Care Coordinated Applicable Imaging Reviewed Interpretation of Laboratory Data incorporated into ED treatment   The patient was considered ready for discharge at 9:20 PM.  I contacted his legal guardian, David [name], listed in the EMR.  She states that she cannot pick him up until about 8:00 tomorrow morning.  She thinks it would be risky to send him back to the shelter at this time.  She states that she is trying to get him into a facility where he can be better managed with for his seizures and autism.  Because of the risk of discharge I consulted transitions of care team.  They saw the patient and consulted with his legal guardian.  The patient will be observed  overnight, then likely discharged with the instructions as below.  He will stay as a border, for now.  I have ordered his home medications.  Plan: Home Medications-continue usual; Home Treatments-regular activity, avoid driving and climbing and bathing alone; return here if the recommended treatment, does not improve the symptoms; Recommended follow up-PCP and neurology 1 to 2 weeks and as needed      [name], MD 10/10/19 2213

## 2019-10-10 NOTE — ED Triage Notes (Signed)
Pt bib ems, reporting that the Pt has a seizure at the homeless shelter and was lowered to the ground. Pt given 5mg  versed im. Recent change in medications however, does not appear to be working.

## 2019-10-10 NOTE — ED Notes (Signed)
Pt ambulated to bathroom and back unassisted. Pt c/o pain to right shoulder. Provider made aware. XR ordered

## 2019-10-10 NOTE — Discharge Instructions (Addendum)
Continue taking all of your medicines as usual.  Use the sling on the right shoulder as needed for comfort.  Take Tylenol as needed for pain.  Follow-up with your primary care doctor and neurologist for checkup in 1 to 2 weeks.  Return here, if needed for problems.

## 2019-10-11 NOTE — ED Notes (Signed)
Breakfast Ordered 

## 2019-10-11 NOTE — ED Notes (Signed)
Pt given graham crackers & sprite per request. Pt playing video games quietly in room, no other needs expressed.

## 2019-10-11 NOTE — Progress Notes (Addendum)
CSW spoke with patient's legal guardian David [name] who reports she will arrive at [facility] ED at 8:30am to pick up the patient.  CSW notified Maggie, Charity fundraiser of information.  Edwin Dada, MSW, LCSW-A Transitions of Care  Clinical Social Worker  [facility] Emergency Departments  Medical ICU [phone]

## 2019-10-11 NOTE — ED Notes (Signed)
Pt resting comfortably. Respirations even, unlabored, NAD noted.

## 2019-10-11 NOTE — ED Notes (Signed)
Pt provided [place] sandwich & sprite per request. No additional needs or requests expressed.

## 2019-10-11 NOTE — ED Notes (Signed)
Pt discharged with caregiver.

## 2019-10-13 ENCOUNTER — Emergency Department (HOSPITAL_COMMUNITY)
Admission: EM | Admit: 2019-10-13 | Discharge: 2019-10-14 | Disposition: A | Discharge status: Home / Self Care | Payer: Medicaid Other | Attending: Emergency Medicine | Admitting: Emergency Medicine

## 2019-10-13 ENCOUNTER — Encounter (HOSPITAL_COMMUNITY): Payer: Self-pay | Admitting: Emergency Medicine

## 2019-10-13 ENCOUNTER — Other Ambulatory Visit: Payer: Self-pay

## 2019-10-13 DIAGNOSIS — Z7722 Contact with and (suspected) exposure to environmental tobacco smoke (acute) (chronic): Secondary | ICD-10-CM | POA: Diagnosis not present

## 2019-10-13 DIAGNOSIS — F445 Conversion disorder with seizures or convulsions: Secondary | ICD-10-CM | POA: Diagnosis not present

## 2019-10-13 DIAGNOSIS — E039 Hypothyroidism, unspecified: Secondary | ICD-10-CM | POA: Insufficient documentation

## 2019-10-13 DIAGNOSIS — Z79899 Other long term (current) drug therapy: Secondary | ICD-10-CM | POA: Diagnosis not present

## 2019-10-13 DIAGNOSIS — R569 Unspecified convulsions: Secondary | ICD-10-CM | POA: Diagnosis present

## 2019-10-13 DIAGNOSIS — F840 Autistic disorder: Secondary | ICD-10-CM | POA: Diagnosis not present

## 2019-10-13 NOTE — ED Triage Notes (Signed)
Per EMS, homeless shelter staff reports two seizures this afternoon.  EMS states he has hx of autism but feels he is at his baseline.  Pt reports he forgot to take his medicine today.  138/90 76 heart rate 99% RA 18 RR  Pt refused CBG

## 2019-10-14 LAB — CBC
HCT: 46.8 % (ref 39.0–52.0)
Hemoglobin: 14.9 g/dL (ref 13.0–17.0)
MCH: 29 pg (ref 26.0–34.0)
MCHC: 31.8 g/dL (ref 30.0–36.0)
MCV: 91.1 fL (ref 80.0–100.0)
Platelets: 172 10*3/uL (ref 150–400)
RBC: 5.14 MIL/uL (ref 4.22–5.81)
RDW: 12.8 % (ref 11.5–15.5)
WBC: 4.6 10*3/uL (ref 4.0–10.5)
nRBC: 0 % (ref 0.0–0.2)

## 2019-10-14 LAB — COMPREHENSIVE METABOLIC PANEL
ALT: 21 U/L (ref 0–44)
AST: 31 U/L (ref 15–41)
Albumin: 3.6 g/dL (ref 3.5–5.0)
Alkaline Phosphatase: 69 U/L (ref 38–126)
Anion gap: 11 (ref 5–15)
BUN: 18 mg/dL (ref 6–20)
CO2: 22 mmol/L (ref 22–32)
Calcium: 9.4 mg/dL (ref 8.9–10.3)
Chloride: 104 mmol/L (ref 98–111)
Creatinine, Ser: 0.87 mg/dL (ref 0.61–1.24)
GFR calc Af Amer: >60 mL/min (ref >60)
GFR calc non Af Amer: >60 mL/min (ref >60)
Glucose, Bld: 95 mg/dL (ref 70–99)
Potassium: 4.8 mmol/L (ref 3.5–5.1)
Sodium: 137 mmol/L (ref 135–145)
Total Bilirubin: 1 mg/dL (ref 0.3–1.2)
Total Protein: 7.2 g/dL (ref 6.5–8.1)

## 2019-10-14 LAB — CBG MONITORING, ED: Glucose-Capillary: 98 mg/dL (ref 70–99)

## 2019-10-14 LAB — MAGNESIUM: Magnesium: 1.8 mg/dL (ref 1.7–2.4)

## 2019-10-14 LAB — VALPROIC ACID LEVEL: Valproic Acid Lvl: 52 ug/mL (ref 50.0–100.0)

## 2019-10-14 MED ORDER — DIVALPROEX SODIUM 250 MG PO DR TAB
1500.0000 mg | DELAYED_RELEASE_TABLET | Freq: Once | ORAL | Status: AC
  Administered 2019-10-14: 1500 mg via ORAL
  Filled 2019-10-14: qty 6

## 2019-10-14 MED ORDER — RISPERIDONE 0.5 MG PO TABS
0.5000 mg | ORAL_TABLET | Freq: Once | ORAL | Status: AC
  Administered 2019-10-14: 0.5 mg via ORAL
  Filled 2019-10-14: qty 1

## 2019-10-14 MED ORDER — ACETAMINOPHEN 325 MG PO TABS
650.0000 mg | ORAL_TABLET | Freq: Once | ORAL | Status: AC
  Administered 2019-10-14: 650 mg via ORAL
  Filled 2019-10-14: qty 2

## 2019-10-14 NOTE — ED Provider Notes (Signed)
[facility] EMERGENCY DEPARTMENT Provider Note   CSN: [id number] Arrival date & time: 10/13/19  1952     History Chief Complaint  Patient presents with  . Seizures    David [name] is a 19 y.o. male with a history of autism, central hypothyroidism, and pseudoseizures who presents to the emergency department who presents to the emergency department by EMS with a chief complaint of seizure-like activity.  Homeless shelter staff reports that the patient had 2 seizures this afternoon.  Patient denies any tongue biting, incontinence, numbness, or weakness.  He is endorsing a mild headache, but denies numbness, weakness, chest pain, shortness of breath, tremors, abdominal pain, nausea, vomiting, diarrhea.  Reports that he has not taken his home medication today.  No other complaints at this time.  The history is provided by the patient. No language interpreter was used.       Past Medical History:  Diagnosis Date  . Autism   . Intellectual disability    Moderate  . Limbic encephalitis   . Seizures ([place])   . Speech delay   . Thyroid disease     Patient Active Problem List   Diagnosis Date Noted  . 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 Seizure disorder ([place]) 04/24/2018  . Seizure ([place]) 04/24/2017  . Acne 11/03/2016  . Depression 11/03/2016  . Intellectual disability 11/03/2016  . 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 Epilepsy ([place]) 11/03/2016  . 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 Hypothyroidism 04/02/2008  . RHINITIS, ALLERGIC 07/26/2006    Past Surgical History:  Procedure Laterality Date  . BRAIN SURGERY     Brain biopsy  . GASTROSTOMY TUBE REVISION         No family history on file.  Social History   Tobacco Use  . Smoking status: Passive Smoke Exposure - Never Smoker  . Smokeless tobacco: Never Used  Substance Use Topics  . Alcohol use: No  . Drug use: No     Home Medications Prior to Admission medications   Medication Sig Start Date End Date Taking? Authorizing Provider  cholecalciferol (VITAMIN D3) 25 MCG (1000 UNIT) tablet Take 1,000 Units by mouth daily.    [provider]  divalproex (DEPAKOTE ER) 500 MG 24 hr tablet Take 1,500 mg by mouth 2 (two) times daily. 09/25/19   [provider]  Eslicarbazepine Acetate 800 MG TABS Take 800 mg by mouth daily with supper. Patient not taking: Reported on 09/27/2019 06/16/18   Dalia Heading, PA-C  levETIRAcetam (KEPPRA) 1000 MG/100ML SOLN Inject 100 mLs (1,000 mg total) into the vein 2 (two) times daily. Patient not taking: Reported on 06/07/2018 04/24/18   Jonetta Osgood, MD  levothyroxine (SYNTHROID) 125 MCG tablet Take 0.5 tablets (62.5 mcg total) by mouth daily. 09/29/19   Patrecia Pour, NP  risperiDONE (RISPERDAL) 0.5 MG tablet Take 1 tablet (0.5 mg total) by mouth 2 (two) times daily. 10/06/19   Dorie Rank, MD    Allergies    Penicillins  Review of Systems   Review of Systems  Constitutional: Negative for appetite change and fever.  HENT: Negative for congestion and sore throat.   Eyes: Negative for visual disturbance.  Respiratory: Negative for shortness of breath.   Cardiovascular: Negative for chest pain.  Gastrointestinal: Negative for abdominal pain, diarrhea, nausea and vomiting.  Genitourinary: Negative for dysuria.  Musculoskeletal: Negative for back pain.  Skin: Negative for rash.  Allergic/Immunologic: Negative for  immunocompromised state.  Neurological: Positive for headaches. Negative for dizziness, seizures, syncope and weakness.  Psychiatric/Behavioral: Negative for confusion.    Physical Exam Updated Vital Signs BP 108/69 (BP Location: Right Arm)   Pulse 77   Temp 98 F (36.7 C) (Oral)   Resp 16   Ht 5\' 4"  (1.626 m)   SpO2 99%   BMI 31.41 kg/m   Physical Exam Vitals and nursing note reviewed.  Constitutional:      Appearance: He is  well-developed.     Comments: Pleasant, cooperative male.  HENT:     Head: Normocephalic.  Eyes:     Conjunctiva/sclera: Conjunctivae normal.  Cardiovascular:     Rate and Rhythm: Normal rate and regular rhythm.     Heart sounds: No murmur.  Pulmonary:     Effort: Pulmonary effort is normal. No respiratory distress.     Breath sounds: No stridor. No wheezing, rhonchi or rales.  Chest:     Chest wall: No tenderness.  Abdominal:     General: There is no distension.     Palpations: Abdomen is soft. There is no mass.     Tenderness: There is no abdominal tenderness. There is no right CVA tenderness, left CVA tenderness, guarding or rebound.     Hernia: No hernia is present.  Musculoskeletal:     Cervical back: Neck supple.  Skin:    General: Skin is warm and dry.  Neurological:     Mental Status: He is alert.     Comments: GCS 15.  Follows simple commands.  Cranial nerves II through XII are grossly intact.  5 out of 5 strength against resistance of bilateral upper and lower extremities.  Sensation is intact and equal throughout.  Ambulates without difficulty.  Psychiatric:        Behavior: Behavior normal.     ED Results / Procedures / Treatments   Labs (all labs ordered are listed, but only abnormal results are displayed) Labs Reviewed  VALPROIC ACID LEVEL  CBC  COMPREHENSIVE METABOLIC PANEL  MAGNESIUM  CBG MONITORING, ED    EKG None  Radiology No results found.  Procedures Procedures (including critical care time)  Medications Ordered in ED Medications  acetaminophen (TYLENOL) tablet 650 mg (650 mg Oral Given 10/14/19 0316)  divalproex (DEPAKOTE) DR tablet 1,500 mg (1,500 mg Oral Given 10/14/19 0316)  risperiDONE (RISPERDAL) tablet 0.5 mg (0.5 mg Oral Given 10/14/19 10/16/19)    ED Course  I have reviewed the triage vital signs and the nursing notes.  Pertinent labs & imaging results that were available during my care of the patient were reviewed by me and  considered in my medical decision making (see chart for details).  Clinical Course as of Oct 13 1141  Tue Oct 14, 2019  Oct 16, 2019 Consulted patient's legal guardian, [phone].  Left voicemail.   [MM]    Clinical Course User Index [MM] Samanth Mirkin, Arnetha Courser, PA-C   MDM Rules/Calculators/A&P                      19 year old male with a history of autism, central hypothyroidism, and pseudoseizures presenting by EMS with complaints of seizure x2.  Patient's medical record has been reviewed, and he has a history of pseudoseizures.  Patient has been observed for more than 12 hours in the emergency department with no repeat seizure-like activity.  Labs are reassuring.  No electrolyte derangements.  Patient did not take his home medications today and was given his  home dose of Keppra and Risperdal.  Patient is appropriate for discharge at this time.  Doubt CVA, ICH, TIA.  Patient has been rechecked multiple times and has been playing on his phone or sleeping.  No acute distress.  Spoke with patient's DSS legal guardian, Arnetha Courser, who will come to the emergency department to pick up the patient between 08:30-09:00.  Diet and home medications have been ordered.  Patient is hemodynamically stable in no acute distress.  ER return precautions given.  Safe discharge to shelter with outpatient follow-up as indicated.  Final Clinical Impression(s) / ED Diagnoses Final diagnoses:  Pseudoseizure    Rx / DC Orders ED Discharge Orders    None       Derald Lorge A, PA-C 10/14/19 1143    Ward, Layla Maw, DO 10/15/19 2224

## 2019-10-14 NOTE — ED Notes (Signed)
Patient should have dss worker called prior to discharge d/t being a ward of the state

## 2019-10-14 NOTE — Discharge Instructions (Addendum)
Thank you for allowing me to care for you today in the Emergency Department.   Continue your home medications.  It is very important that you do not miss any doses of your medication.  Return to the emergency department for new or worsening symptoms.

## 2019-10-14 NOTE — Progress Notes (Addendum)
CSW spoke with Arnetha Courser who states she is on her way to pick up this patient.  Edwin Dada, MSW, LCSW-A Transitions of Care  Clinical Social Worker  [facility] Emergency Departments  Medical ICU [phone]

## 2019-10-14 NOTE — ED Notes (Signed)
Patient verbalizes understanding of discharge instructions. Opportunity for questioning and answers were provided. Armband removed by staff, pt discharged from ED with patients legal guardian.

## 2019-10-14 NOTE — ED Provider Notes (Signed)
Social work has seen patient and legal guardian is coming to pick him up.  Labs are reviewed and unremarkable.  He is resting comfortably.  Appears stable for discharge.   Pricilla Loveless, MD 10/14/19 [phone]

## 2019-10-17 ENCOUNTER — Emergency Department (HOSPITAL_COMMUNITY)
Admission: EM | Admit: 2019-10-17 | Discharge: 2019-10-17 | Disposition: A | Discharge status: Home / Self Care | Payer: Medicaid Other | Attending: Emergency Medicine | Admitting: Emergency Medicine

## 2019-10-17 ENCOUNTER — Other Ambulatory Visit: Payer: Self-pay

## 2019-10-17 ENCOUNTER — Encounter (HOSPITAL_COMMUNITY): Payer: Self-pay

## 2019-10-17 DIAGNOSIS — R569 Unspecified convulsions: Secondary | ICD-10-CM | POA: Insufficient documentation

## 2019-10-17 DIAGNOSIS — Z79899 Other long term (current) drug therapy: Secondary | ICD-10-CM | POA: Insufficient documentation

## 2019-10-17 DIAGNOSIS — Z7722 Contact with and (suspected) exposure to environmental tobacco smoke (acute) (chronic): Secondary | ICD-10-CM | POA: Insufficient documentation

## 2019-10-17 DIAGNOSIS — F840 Autistic disorder: Secondary | ICD-10-CM | POA: Diagnosis not present

## 2019-10-17 MED ORDER — LEVETIRACETAM IN NACL 1000 MG/100ML IV SOLN
1000.0000 mg | Freq: Once | INTRAVENOUS | Status: AC
  Administered 2019-10-17: 1000 mg via INTRAVENOUS
  Filled 2019-10-17: qty 100

## 2019-10-17 NOTE — ED Provider Notes (Signed)
[facility]-EMERGENCY DEPT Provider Note   CSN: [id number] Arrival date & time: 10/17/19  2013     History No chief complaint on file.   David [name] is a 19 y.o. male.  19 year old male who presents after having possible seizure activity at a shelter just prior to arrival.  Patient has history of pseudoseizures and states he has been noncompliant with his medications.  He does take Keppra and Depakote although it from prior medical history looks like Keppra is the main medication he takes for seizures.  Patient also is autistic and history is very difficult and has intellectual disability.  When EMS arrived patient was alert and oriented times his baseline.  He was not postictal and ambulated easily to the stretcher.  CBG was 112.        Past Medical History:  Diagnosis Date  . Autism   . Intellectual disability    Moderate  . Limbic encephalitis   . Seizures ([facility])   . Speech delay   . Thyroid disease     Patient Active Problem List   Diagnosis Date Noted  . 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 Seizure disorder ([facility]) 04/24/2018  . Seizure ([facility]) 04/24/2017  . Acne 11/03/2016  . Depression 11/03/2016  . Intellectual disability 11/03/2016  . 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 Hypothyroidism 04/02/2008  . RHINITIS, ALLERGIC 07/26/2006    Past Surgical History:  Procedure Laterality Date  . BRAIN SURGERY     Brain biopsy  . GASTROSTOMY TUBE REVISION         History reviewed. No pertinent family history.  Social History   Tobacco Use  . Smoking status: Passive Smoke Exposure - Never Smoker  . Smokeless tobacco: Never Used  Substance Use Topics  . Alcohol use: No  . Drug use: No    Home Medications Prior to Admission medications   Medication Sig Start Date End Date Taking? Authorizing Provider    cholecalciferol (VITAMIN D3) 25 MCG (1000 UNIT) tablet Take 1,000 Units by mouth daily.   Yes [provider]  divalproex (DEPAKOTE ER) 500 MG 24 hr tablet Take 1,500 mg by mouth 2 (two) times daily. 09/25/19  Yes [provider]  levothyroxine (SYNTHROID) 125 MCG tablet Take 0.5 tablets (62.5 mcg total) by mouth daily. 09/29/19  Yes Charm Rings, NP  risperiDONE (RISPERDAL) 0.5 MG tablet Take 1 tablet (0.5 mg total) by mouth 2 (two) times daily. 10/06/19  Yes Linwood Dibbles, MD  Eslicarbazepine Acetate 800 MG TABS Take 800 mg by mouth daily with supper. Patient not taking: Reported on 09/27/2019 06/16/18   Charlestine Night, PA-C  levETIRAcetam (KEPPRA) 1000 MG/100ML SOLN Inject 100 mLs (1,000 mg total) into the vein 2 (two) times daily. Patient not taking: Reported on 06/07/2018 04/24/18   Maretta Bees, MD    Allergies    Penicillins  Review of Systems   Review of Systems  Unable to perform ROS: Psychiatric disorder    Physical Exam Updated Vital Signs BP 122/73 (BP Location: Left Arm)   Pulse 94   Temp 98.6 F (37 C) (Oral)   Resp 18   Ht 1.626 m (5\' 4" )   SpO2 98%   BMI 31.41 kg/m   Physical Exam Vitals and nursing note reviewed.  Constitutional:      General: He is not in acute distress.  Appearance: Normal appearance. He is well-developed. He is not toxic-appearing.  HENT:     Head: Normocephalic and atraumatic.  Eyes:     General: Lids are normal.     Conjunctiva/sclera: Conjunctivae normal.     Pupils: Pupils are equal, round, and reactive to light.  Neck:     Thyroid: No thyroid mass.     Trachea: No tracheal deviation.  Cardiovascular:     Rate and Rhythm: Normal rate and regular rhythm.     Heart sounds: Normal heart sounds. No murmur. No gallop.   Pulmonary:     Effort: Pulmonary effort is normal. No respiratory distress.     Breath sounds: Normal breath sounds. No stridor. No decreased breath sounds, wheezing, rhonchi or rales.   Abdominal:     General: Bowel sounds are normal. There is no distension.     Palpations: Abdomen is soft.     Tenderness: There is no abdominal tenderness. There is no rebound.  Musculoskeletal:        General: No tenderness. Normal range of motion.     Cervical back: Normal range of motion and neck supple.  Skin:    General: Skin is warm and dry.     Findings: No abrasion or rash.  Neurological:     Mental Status: He is alert and oriented to person, place, and time.     GCS: GCS eye subscore is 4. GCS verbal subscore is 5. GCS motor subscore is 6.     Cranial Nerves: No cranial nerve deficit.     Sensory: No sensory deficit.  Psychiatric:        Attention and Perception: He is inattentive.        Mood and Affect: Affect is blunt.        Speech: Speech is delayed.        Behavior: Behavior is withdrawn.     ED Results / Procedures / Treatments   Labs (all labs ordered are listed, but only abnormal results are displayed) Labs Reviewed - No data to display  EKG None  Radiology No results found.  Procedures Procedures (including critical care time)  Medications Ordered in ED Medications  levETIRAcetam (KEPPRA) IVPB 1000 mg/100 mL premix (has no administration in time range)    ED Course  I have reviewed the triage vital signs and the nursing notes.  Pertinent labs & imaging results that were available during my care of the patient were reviewed by me and considered in my medical decision making (see chart for details).    MDM Rules/Calculators/A&P                      On arrival patient was not postictal and was at his baseline here.  Patient is a history of pseudoseizures as well as medication noncompliance.  Was given 1 g of Keppra IV.  Case discussed with his DSS caseworker who states patient will be placed into a group home in 7 days which is why he is staying at the homeless shelter.  He was able to take a meal here and is currently ambulatory and  nontoxic-appearing will be sent back to the homeless shelter by transportation Final Clinical Impression(s) / ED Diagnoses Final diagnoses:  None    Rx / DC Orders ED Discharge Orders    None       Lacretia Leigh, MD 10/17/19 2139

## 2019-10-17 NOTE — ED Notes (Signed)
Pt DSS guardian was left a message to contact hospital

## 2019-10-17 NOTE — ED Triage Notes (Signed)
Pt BIB EMS from homeless shelter. Pt had 4 witnessed seizures. Pt was post-ictal upon EMS arrival. Pt is prescribed depakote but DSS has not gotten his prescription. Pt has hx of autism.  HR 84 98% RA Temp 98.1 CBG 112

## 2019-10-18 ENCOUNTER — Emergency Department (HOSPITAL_COMMUNITY)
Admission: EM | Admit: 2019-10-18 | Discharge: 2019-10-18 | Disposition: A | Discharge status: Home / Self Care | Payer: Medicaid Other | Attending: Emergency Medicine | Admitting: Emergency Medicine

## 2019-10-18 DIAGNOSIS — R42 Dizziness and giddiness: Secondary | ICD-10-CM | POA: Insufficient documentation

## 2019-10-18 DIAGNOSIS — Z7722 Contact with and (suspected) exposure to environmental tobacco smoke (acute) (chronic): Secondary | ICD-10-CM | POA: Insufficient documentation

## 2019-10-18 DIAGNOSIS — R569 Unspecified convulsions: Secondary | ICD-10-CM | POA: Diagnosis present

## 2019-10-18 DIAGNOSIS — F840 Autistic disorder: Secondary | ICD-10-CM | POA: Diagnosis not present

## 2019-10-18 DIAGNOSIS — F71 Moderate intellectual disabilities: Secondary | ICD-10-CM | POA: Diagnosis not present

## 2019-10-18 LAB — CBC
HCT: 45.4 % (ref 39.0–52.0)
Hemoglobin: 14.9 g/dL (ref 13.0–17.0)
MCH: 29.6 pg (ref 26.0–34.0)
MCHC: 32.8 g/dL (ref 30.0–36.0)
MCV: 90.3 fL (ref 80.0–100.0)
Platelets: 181 10*3/uL (ref 150–400)
RBC: 5.03 MIL/uL (ref 4.22–5.81)
RDW: 12.7 % (ref 11.5–15.5)
WBC: 3.9 10*3/uL — ABNORMAL LOW (ref 4.0–10.5)
nRBC: 0 % (ref 0.0–0.2)

## 2019-10-18 LAB — COMPREHENSIVE METABOLIC PANEL
ALT: 18 U/L (ref 0–44)
AST: 26 U/L (ref 15–41)
Albumin: 3.9 g/dL (ref 3.5–5.0)
Alkaline Phosphatase: 73 U/L (ref 38–126)
Anion gap: 7 (ref 5–15)
BUN: 17 mg/dL (ref 6–20)
CO2: 28 mmol/L (ref 22–32)
Calcium: 9.6 mg/dL (ref 8.9–10.3)
Chloride: 103 mmol/L (ref 98–111)
Creatinine, Ser: 0.8 mg/dL (ref 0.61–1.24)
GFR calc Af Amer: >60 mL/min (ref >60)
GFR calc non Af Amer: >60 mL/min (ref >60)
Glucose, Bld: 131 mg/dL — ABNORMAL HIGH (ref 70–99)
Potassium: 4.3 mmol/L (ref 3.5–5.1)
Sodium: 138 mmol/L (ref 135–145)
Total Bilirubin: 0.6 mg/dL (ref 0.3–1.2)
Total Protein: 7.7 g/dL (ref 6.5–8.1)

## 2019-10-18 NOTE — ED Triage Notes (Signed)
Transported by [facility] from home--witnessed seizure, history of seizures and was last seen last Tuesday at [facility] for same. Reports medication compliance. VSS with EMS. CBG read 92 mg/dl. Takes Keppra. AAO x4.

## 2019-10-18 NOTE — ED Notes (Signed)
Patient called shelter worker,Tabatha, and handed this RN the phone. Reports she will provide transport for patient in approximately twenty minutes.

## 2019-10-18 NOTE — Discharge Instructions (Addendum)
You were evaluated in the Emergency Department and after careful evaluation, we did not find any emergent condition requiring admission or further testing in the hospital.  Your exam/testing today was overall reassuring.  Continue taking your Keppra as directed.  Please return to the Emergency Department if you experience any worsening of your condition.  We encourage you to follow up with a primary care provider.  Thank you for allowing [place] to be a part of your care.

## 2019-10-18 NOTE — ED Provider Notes (Signed)
[facility]-EMERGENCY DEPT [facility] Emergency Department Provider Note MRN:  [id number]  Arrival date & time: 10/18/19     Chief Complaint   Seizures   History of Present Illness   David ENGEN is a 19 y.o. year-old male with a history of intellectual disability, seizures, pseudoseizures presenting to the ED with chief complaint of seizures.  Patient explains that he became lightheaded, dizzy, and had shaking behavior.  Does not remember the whole episode.  Currently without complaints except the pain in his hand from the IV.  Denies numbness or weakness, no recent fever or illness.  No chest pain or shortness of breath.  Review of Systems  A complete 10 system review of systems was obtained and all systems are negative except as noted in the HPI and PMH.   Patient's Health History    Past Medical History:  Diagnosis Date  . Autism   . Intellectual disability    Moderate  . Limbic encephalitis   . Seizures (HCC)   . Speech delay   . Thyroid disease     Past Surgical History:  Procedure Laterality Date  . BRAIN SURGERY     Brain biopsy  . GASTROSTOMY TUBE REVISION      No family history on file.  Social History   Socioeconomic History  . Marital status: Single    Spouse name: Not on file  . Number of children: Not on file  . Years of education: Not on file  . Highest education level: Not on file  Occupational History  . Occupation: Unemployed  Tobacco Use  . Smoking status: Passive Smoke Exposure - Never Smoker  . Smokeless tobacco: Never Used  Substance and Sexual Activity  . Alcohol use: No  . Drug use: No  . Sexual activity: Never  Other Topics Concern  . Not on file  Social History Narrative   Pt lives in [place] in a shelter.  Unemployed.  Currently not followed by an outpatient psychiatrist.  Pt has a Child psychotherapist and guardian -- Arnetha Courser [phone]   Social Determinants of Health   Financial Resource Strain:   . Difficulty of Paying  Living Expenses:   Food Insecurity:   . Worried About Programme researcher, broadcasting/film/video in the Last Year:   . Barista in the Last Year:   Transportation Needs:   . Freight forwarder (Medical):   Marland Kitchen Lack of Transportation (Non-Medical):   Physical Activity:   . Days of Exercise per Week:   . Minutes of Exercise per Session:   Stress:   . Feeling of Stress :   Social Connections:   . Frequency of Communication with Friends and Family:   . Frequency of Social Gatherings with Friends and Family:   . Attends Religious Services:   . Active Member of Clubs or Organizations:   . Attends Banker Meetings:   Marland Kitchen Marital Status:   Intimate Partner Violence:   . Fear of Current or Ex-Partner:   . Emotionally Abused:   Marland Kitchen Physically Abused:   . Sexually Abused:      Physical Exam   Vitals:   10/18/19 1153 10/18/19 1200  BP: 131/86 131/70  Pulse: 79 68  Resp: 19 20  Temp: 98.2 F (36.8 C)   SpO2: 98% 98%    CONSTITUTIONAL: Well-appearing, NAD NEURO:  Alert and oriented, normal and symmetric strength and sensation, normal coordination, normal speech EYES:  eyes equal and reactive ENT/NECK:  no LAD, no  JVD CARDIO: Regular rate, well-perfused, normal S1 and S2 PULM:  CTAB no wheezing or rhonchi GI/GU:  normal bowel sounds, non-distended, non-tender MSK/SPINE:  No gross deformities, no edema SKIN:  no rash, atraumatic PSYCH:  Appropriate speech and behavior  *Additional and/or pertinent findings included in MDM below  Diagnostic and Interventional Summary    EKG Interpretation  Date/Time:   Oct 18, 2019 at 11:50:42 Ventricular Rate:   67 PR Interval:   131 QRS Duration:  94 QT Interval:   349 QTC Calculation:  369 R Axis:     Text Interpretation:  Sinus rhythm normal intervals Confirmed by Dr. Gerlene Fee at 12:32pm      Labs Reviewed  CBC - Abnormal; Notable for the following components:      Result Value   WBC 3.9 (*)    All other components within  normal limits  COMPREHENSIVE METABOLIC PANEL - Abnormal; Notable for the following components:   Glucose, Bld 131 (*)    All other components within normal limits    No orders to display    Medications - No data to display   Procedures  /  Critical Care Procedures  ED Course and Medical Decision Making  I have reviewed the triage vital signs, the nursing notes, and pertinent available records from the EMR.  Listed above are laboratory and imaging tests that I personally ordered, reviewed, and interpreted and then considered in my medical decision making (see below for details).      Seizure versus pseudoseizure, reassuring vital signs and exam, awaiting baseline labs, currently I see no indication for CNS imaging.  Endorses compliance with Keppra.  Work-up is reassuring, no seizure activity here in the emergency department, appropriate for discharge.  Barth Kirks. [place]onia Small, [place] [email or web address]  Final Clinical Impressions(s) / ED Diagnoses     ICD-10-CM   1. Seizure-like activity ([place])  R56.9     ED Discharge Orders    None       Discharge Instructions Discussed with and Provided to Patient:     Discharge Instructions     You were evaluated in the Emergency Department and after careful evaluation, we did not find any emergent condition requiring admission or further testing in the hospital.  Your exam/testing today was overall reassuring.  Continue taking your Keppra as directed.  Please return to the Emergency Department if you experience any worsening of your condition.  We encourage you to follow up with a primary care provider.  Thank you for allowing [place] to be a part of your care.        Maudie Flakes, MD 10/18/19 1304

## 2019-10-18 NOTE — ED Notes (Signed)
ED Provider at bedside. 

## 2019-10-20 ENCOUNTER — Encounter (HOSPITAL_COMMUNITY): Payer: Self-pay

## 2019-10-20 ENCOUNTER — Emergency Department (HOSPITAL_COMMUNITY)
Admission: EM | Admit: 2019-10-20 | Discharge: 2019-10-20 | Disposition: A | Discharge status: Home / Self Care | Payer: Medicaid Other | Attending: Emergency Medicine | Admitting: Emergency Medicine

## 2019-10-20 DIAGNOSIS — F445 Conversion disorder with seizures or convulsions: Secondary | ICD-10-CM | POA: Insufficient documentation

## 2019-10-20 DIAGNOSIS — F71 Moderate intellectual disabilities: Secondary | ICD-10-CM | POA: Insufficient documentation

## 2019-10-20 DIAGNOSIS — Z7722 Contact with and (suspected) exposure to environmental tobacco smoke (acute) (chronic): Secondary | ICD-10-CM | POA: Diagnosis not present

## 2019-10-20 DIAGNOSIS — R569 Unspecified convulsions: Secondary | ICD-10-CM | POA: Diagnosis present

## 2019-10-20 DIAGNOSIS — E039 Hypothyroidism, unspecified: Secondary | ICD-10-CM | POA: Diagnosis not present

## 2019-10-20 DIAGNOSIS — F840 Autistic disorder: Secondary | ICD-10-CM | POA: Diagnosis not present

## 2019-10-20 DIAGNOSIS — Z79899 Other long term (current) drug therapy: Secondary | ICD-10-CM | POA: Diagnosis not present

## 2019-10-20 LAB — CBG MONITORING, ED: Glucose-Capillary: 88 mg/dL (ref 70–99)

## 2019-10-20 NOTE — ED Provider Notes (Signed)
[facility] DEPT Provider Note   CSN: [id number] Arrival date & time: 10/20/19  1011     History Chief Complaint  Patient presents with  . Seizures    David [name] is a 19 y.o. male.  19 year old male with history of seizures and pseudoseizures who presents after having a shaking episode today at the shelter.  Patient seen here twice with similar symptoms recently.  Was on myself and was given Keppra.  According to EMS today, patient was not postictal.  Patient has been at his baseline.  Does have a history of IDD and is currently awaiting group home placement which will be later this week.        Past Medical History:  Diagnosis Date  . Autism   . Intellectual disability    Moderate  . Limbic encephalitis   . Seizures ([place])   . Speech delay   . Thyroid disease     Patient Active Problem List   Diagnosis Date Noted  . 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 Seizure disorder ([place]) 04/24/2018  . Seizure ([place] ) 04/24/2017  . Acne 11/03/2016  . Depression 11/03/2016  . Intellectual disability 11/03/2016  . 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 Epilepsy ([place]) 11/03/2016  . 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 Hypothyroidism 04/02/2008  . RHINITIS, ALLERGIC 07/26/2006    Past Surgical History:  Procedure Laterality Date  . BRAIN SURGERY     Brain biopsy  . GASTROSTOMY TUBE REVISION         History reviewed. No pertinent family history.  Social History   Tobacco Use  . Smoking status: Passive Smoke Exposure - Never Smoker  . Smokeless tobacco: Never Used  Substance Use Topics  . Alcohol use: No  . Drug use: No    Home Medications Prior to Admission medications   Medication Sig Start Date End Date Taking? Authorizing Provider  cholecalciferol (VITAMIN D3) 25 MCG (1000 UNIT) tablet Take 1,000 Units by mouth daily.    [provider]  divalproex  (DEPAKOTE ER) 500 MG 24 hr tablet Take 1,500 mg by mouth 2 (two) times daily. 09/25/19   [provider]  Eslicarbazepine Acetate 800 MG TABS Take 800 mg by mouth daily with supper. Patient not taking: Reported on 09/27/2019 06/16/18   Dalia Heading, PA-C  levETIRAcetam (KEPPRA) 1000 MG/100ML SOLN Inject 100 mLs (1,000 mg total) into the vein 2 (two) times daily. Patient not taking: Reported on 06/07/2018 04/24/18   Jonetta Osgood, MD  levothyroxine (SYNTHROID) 125 MCG tablet Take 0.5 tablets (62.5 mcg total) by mouth daily. 09/29/19   Patrecia Pour, NP  risperiDONE (RISPERDAL) 0.5 MG tablet Take 1 tablet (0.5 mg total) by mouth 2 (two) times daily. 10/06/19   Dorie Rank, MD    Allergies    Penicillins  Review of Systems   Review of Systems  Unable to perform ROS: Psychiatric disorder    Physical Exam Updated Vital Signs BP 106/76   Pulse 75   Temp 98 F (36.7 C)   Resp (!) 21   SpO2 99%   Physical Exam Vitals and nursing note reviewed.  Constitutional:      General: He is not in acute distress.    Appearance: Normal appearance. He is well-developed. He is not toxic-appearing.  HENT:     Head: Normocephalic and atraumatic.  Eyes:     General: Lids are normal.     Conjunctiva/sclera: Conjunctivae  normal.     Pupils: Pupils are equal, round, and reactive to light.  Neck:     Thyroid: No thyroid mass.     Trachea: No tracheal deviation.  Cardiovascular:     Rate and Rhythm: Normal rate and regular rhythm.     Heart sounds: Normal heart sounds. No murmur. No gallop.   Pulmonary:     Effort: Pulmonary effort is normal. No respiratory distress.     Breath sounds: Normal breath sounds. No stridor. No decreased breath sounds, wheezing, rhonchi or rales.  Abdominal:     General: Bowel sounds are normal. There is no distension.     Palpations: Abdomen is soft.     Tenderness: There is no abdominal tenderness. There is no rebound.  Musculoskeletal:        General:  No tenderness. Normal range of motion.     Cervical back: Normal range of motion and neck supple.  Skin:    General: Skin is warm and dry.     Findings: No abrasion or rash.  Neurological:     Mental Status: He is alert and oriented to person, place, and time.     GCS: GCS eye subscore is 4. GCS verbal subscore is 5. GCS motor subscore is 6.     Cranial Nerves: No cranial nerve deficit.     Sensory: No sensory deficit.  Psychiatric:        Attention and Perception: He is inattentive.        Mood and Affect: Affect is flat.        Speech: Speech is delayed.        Behavior: Behavior is withdrawn.     ED Results / Procedures / Treatments   Labs (all labs ordered are listed, but only abnormal results are displayed) Labs Reviewed  CBG MONITORING, ED    EKG None  Radiology No results found.  Procedures Procedures (including critical care time)  Medications Ordered in ED Medications - No data to display  ED Course  I have reviewed the triage vital signs and the nursing notes.  Pertinent labs & imaging results that were available during my care of the patient were reviewed by me and considered in my medical decision making (see chart for details).    MDM Rules/Calculators/A&P                     Patient CBG is normal here.  He has had his baseline.  Discussed with his guardian, Sander Radon, through [facility] DSS, patient will have to remain at the shelter until placement can be arranged.  Will discharge back Final Clinical Impression(s) / ED Diagnoses Final diagnoses:  None    Rx / DC Orders ED Discharge Orders    None       Lorre Nick, MD 10/20/19 1049

## 2019-10-20 NOTE — Discharge Instructions (Addendum)
Patient has a known history of psychiatric seizures.  Please have patient return should he have an episode of being unresponsive with whole body shaking and associated confusion afterwards.  Patient will have periodic shaking but if he is awake and alert, concerning monitoring as opposed to transporting

## 2019-10-20 NOTE — ED Notes (Signed)
[place].EDP at bedside.

## 2019-10-20 NOTE — ED Notes (Addendum)
Patient discharged with safe transport set up by social worker to return back to [facility].

## 2019-10-20 NOTE — Progress Notes (Signed)
CSW in contact with safe transport to arrange for patient discharge. Transportation was set up for patient to return to [facility].   Aseel Uhde Sherryle Lis LCSWA Transitions of Care  Clinical Social Worker  Ph: [phone]

## 2019-10-20 NOTE — ED Notes (Addendum)
Spoke to social work to set up cab ride home.   Per Freida Busman. EDP ok for patient to transport via cab back home.

## 2019-10-20 NOTE — ED Notes (Signed)
Spoke to [place] at [facility] and made aware patient is discharge.   Spoke to [employer] DSS and told to send patient back to [facility] in cab and states last time he was sent home in cab.

## 2019-10-20 NOTE — ED Triage Notes (Signed)
Patient arrived via [facility] from [facility]   Witness "shaking"  Takes Keppra, Depakote, Risperdal, vit D per ems.    Hx. Seizures  Social worker is legal guardian Aeronautical engineer of state.   Contact for DSS [phone]   Contact information at bedside.

## 2019-10-23 ENCOUNTER — Encounter (HOSPITAL_COMMUNITY): Payer: Self-pay | Admitting: Emergency Medicine

## 2019-10-23 ENCOUNTER — Inpatient Hospital Stay (HOSPITAL_COMMUNITY)
Admission: EM | Admit: 2019-10-23 | Discharge: 2019-10-28 | DRG: 101 | Disposition: A | Discharge status: Home / Self Care | Payer: Medicaid Other | Attending: Internal Medicine | Admitting: Internal Medicine

## 2019-10-23 ENCOUNTER — Other Ambulatory Visit: Payer: Self-pay

## 2019-10-23 ENCOUNTER — Other Ambulatory Visit: Payer: Self-pay | Admitting: Emergency Medicine

## 2019-10-23 ENCOUNTER — Emergency Department (HOSPITAL_COMMUNITY): Payer: Medicaid Other

## 2019-10-23 DIAGNOSIS — G40111 Localization-related (focal) (partial) symptomatic epilepsy and epileptic syndromes with simple partial seizures, intractable, with status epilepticus: Secondary | ICD-10-CM | POA: Diagnosis not present

## 2019-10-23 DIAGNOSIS — Q211 Atrial septal defect: Secondary | ICD-10-CM | POA: Diagnosis not present

## 2019-10-23 DIAGNOSIS — E038 Other specified hypothyroidism: Secondary | ICD-10-CM | POA: Diagnosis not present

## 2019-10-23 DIAGNOSIS — Z5329 Procedure and treatment not carried out because of patient's decision for other reasons: Secondary | ICD-10-CM | POA: Diagnosis not present

## 2019-10-23 DIAGNOSIS — Z590 Homelessness: Secondary | ICD-10-CM

## 2019-10-23 DIAGNOSIS — Z79899 Other long term (current) drug therapy: Secondary | ICD-10-CM

## 2019-10-23 DIAGNOSIS — T426X5A Adverse effect of other antiepileptic and sedative-hypnotic drugs, initial encounter: Secondary | ICD-10-CM | POA: Diagnosis not present

## 2019-10-23 DIAGNOSIS — E039 Hypothyroidism, unspecified: Secondary | ICD-10-CM

## 2019-10-23 DIAGNOSIS — G9340 Encephalopathy, unspecified: Secondary | ICD-10-CM

## 2019-10-23 DIAGNOSIS — R739 Hyperglycemia, unspecified: Secondary | ICD-10-CM | POA: Diagnosis not present

## 2019-10-23 DIAGNOSIS — Z6831 Body mass index (BMI) 31.0-31.9, adult: Secondary | ICD-10-CM | POA: Diagnosis not present

## 2019-10-23 DIAGNOSIS — F79 Unspecified intellectual disabilities: Secondary | ICD-10-CM

## 2019-10-23 DIAGNOSIS — G9389 Other specified disorders of brain: Secondary | ICD-10-CM | POA: Diagnosis present

## 2019-10-23 DIAGNOSIS — Y92009 Unspecified place in unspecified non-institutional (private) residence as the place of occurrence of the external cause: Secondary | ICD-10-CM

## 2019-10-23 DIAGNOSIS — E7220 Disorder of urea cycle metabolism, unspecified: Secondary | ICD-10-CM | POA: Diagnosis not present

## 2019-10-23 DIAGNOSIS — F809 Developmental disorder of speech and language, unspecified: Secondary | ICD-10-CM | POA: Diagnosis present

## 2019-10-23 DIAGNOSIS — Z7722 Contact with and (suspected) exposure to environmental tobacco smoke (acute) (chronic): Secondary | ICD-10-CM | POA: Diagnosis present

## 2019-10-23 DIAGNOSIS — Z20822 Contact with and (suspected) exposure to covid-19: Secondary | ICD-10-CM | POA: Diagnosis present

## 2019-10-23 DIAGNOSIS — R569 Unspecified convulsions: Secondary | ICD-10-CM | POA: Diagnosis present

## 2019-10-23 DIAGNOSIS — E669 Obesity, unspecified: Secondary | ICD-10-CM | POA: Diagnosis present

## 2019-10-23 DIAGNOSIS — Z880 Allergy status to penicillin: Secondary | ICD-10-CM

## 2019-10-23 DIAGNOSIS — F840 Autistic disorder: Secondary | ICD-10-CM | POA: Diagnosis not present

## 2019-10-23 DIAGNOSIS — I480 Paroxysmal atrial fibrillation: Secondary | ICD-10-CM | POA: Diagnosis not present

## 2019-10-23 DIAGNOSIS — Z8661 Personal history of infections of the central nervous system: Secondary | ICD-10-CM

## 2019-10-23 DIAGNOSIS — Z79890 Hormone replacement therapy (postmenopausal): Secondary | ICD-10-CM

## 2019-10-23 DIAGNOSIS — G40909 Epilepsy, unspecified, not intractable, without status epilepticus: Secondary | ICD-10-CM

## 2019-10-23 DIAGNOSIS — E8809 Other disorders of plasma-protein metabolism, not elsewhere classified: Secondary | ICD-10-CM | POA: Diagnosis present

## 2019-10-23 LAB — COMPREHENSIVE METABOLIC PANEL
ALT: 21 U/L (ref 0–44)
AST: 29 U/L (ref 15–41)
Albumin: 3.8 g/dL (ref 3.5–5.0)
Alkaline Phosphatase: 64 U/L (ref 38–126)
Anion gap: 8 (ref 5–15)
BUN: 19 mg/dL (ref 6–20)
CO2: 29 mmol/L (ref 22–32)
Calcium: 9.2 mg/dL (ref 8.9–10.3)
Chloride: 102 mmol/L (ref 98–111)
Creatinine, Ser: 0.9 mg/dL (ref 0.61–1.24)
GFR calc Af Amer: >60 mL/min (ref >60)
GFR calc non Af Amer: >60 mL/min (ref >60)
Glucose, Bld: 93 mg/dL (ref 70–99)
Potassium: 4.8 mmol/L (ref 3.5–5.1)
Sodium: 139 mmol/L (ref 135–145)
Total Bilirubin: 0.6 mg/dL (ref 0.3–1.2)
Total Protein: 7.4 g/dL (ref 6.5–8.1)

## 2019-10-23 LAB — CBC WITH DIFFERENTIAL/PLATELET
Abs Immature Granulocytes: 0.02 10*3/uL (ref 0.00–0.07)
Basophils Absolute: 0 10*3/uL (ref 0.0–0.1)
Basophils Relative: 1 %
Eosinophils Absolute: 0.2 10*3/uL (ref 0.0–0.5)
Eosinophils Relative: 3 %
HCT: 44.4 % (ref 39.0–52.0)
Hemoglobin: 14.4 g/dL (ref 13.0–17.0)
Immature Granulocytes: 0 %
Lymphocytes Relative: 38 %
Lymphs Abs: 1.8 10*3/uL (ref 0.7–4.0)
MCH: 29.4 pg (ref 26.0–34.0)
MCHC: 32.4 g/dL (ref 30.0–36.0)
MCV: 90.8 fL (ref 80.0–100.0)
Monocytes Absolute: 0.7 10*3/uL (ref 0.1–1.0)
Monocytes Relative: 15 %
Neutro Abs: 1.9 10*3/uL (ref 1.7–7.7)
Neutrophils Relative %: 43 %
Platelets: 172 10*3/uL (ref 150–400)
RBC: 4.89 MIL/uL (ref 4.22–5.81)
RDW: 12.8 % (ref 11.5–15.5)
WBC: 4.6 10*3/uL (ref 4.0–10.5)
nRBC: 0 % (ref 0.0–0.2)

## 2019-10-23 LAB — MAGNESIUM: Magnesium: 1.8 mg/dL (ref 1.7–2.4)

## 2019-10-23 LAB — SARS CORONAVIRUS 2 BY RT PCR (HOSPITAL ORDER, PERFORMED IN ~~LOC~~ HOSPITAL LAB): SARS Coronavirus 2: NEGATIVE

## 2019-10-23 LAB — VALPROIC ACID LEVEL: Valproic Acid Lvl: 108 ug/mL — ABNORMAL HIGH (ref 50.0–100.0)

## 2019-10-23 LAB — AMMONIA: Ammonia: 61 umol/L — ABNORMAL HIGH (ref 9–35)

## 2019-10-23 MED ORDER — LEVOTHYROXINE SODIUM 125 MCG PO TABS
62.5000 ug | ORAL_TABLET | Freq: Every day | ORAL | Status: DC
  Administered 2019-10-24 – 2019-10-28 (×5): 62.5 ug via ORAL
  Filled 2019-10-23 (×5): qty 0.5

## 2019-10-23 MED ORDER — ONDANSETRON HCL 4 MG/2ML IJ SOLN: 4.0000 mg | Freq: Four times a day (QID) | INTRAMUSCULAR | Status: DC | PRN

## 2019-10-23 MED ORDER — SODIUM CHLORIDE 0.9% FLUSH
3.0000 mL | Freq: Two times a day (BID) | INTRAVENOUS | Status: DC
  Administered 2019-10-23 – 2019-10-28 (×10): 3 mL via INTRAVENOUS

## 2019-10-23 MED ORDER — DOCUSATE SODIUM 100 MG PO CAPS
100.0000 mg | ORAL_CAPSULE | Freq: Two times a day (BID) | ORAL | Status: DC
  Administered 2019-10-24 – 2019-10-28 (×9): 100 mg via ORAL
  Filled 2019-10-23 (×9): qty 1

## 2019-10-23 MED ORDER — LEVETIRACETAM IN NACL 1500 MG/100ML IV SOLN
1500.0000 mg | Freq: Once | INTRAVENOUS | Status: AC
  Administered 2019-10-23: 1500 mg via INTRAVENOUS
  Filled 2019-10-23: qty 100

## 2019-10-23 MED ORDER — ACETAMINOPHEN 325 MG PO TABS
650.0000 mg | ORAL_TABLET | Freq: Four times a day (QID) | ORAL | Status: DC | PRN
  Administered 2019-10-25: 650 mg via ORAL
  Filled 2019-10-23: qty 2

## 2019-10-23 MED ORDER — LEVETIRACETAM 500 MG PO TABS
500.0000 mg | ORAL_TABLET | Freq: Two times a day (BID) | ORAL | Status: DC
  Administered 2019-10-24 – 2019-10-28 (×9): 500 mg via ORAL
  Filled 2019-10-23 (×9): qty 1

## 2019-10-23 MED ORDER — ACETAMINOPHEN 650 MG RE SUPP: 650.0000 mg | Freq: Four times a day (QID) | RECTAL | Status: DC | PRN

## 2019-10-23 MED ORDER — ONDANSETRON HCL 4 MG PO TABS: 4.0000 mg | ORAL_TABLET | Freq: Four times a day (QID) | ORAL | Status: DC | PRN

## 2019-10-23 MED ORDER — DIVALPROEX SODIUM ER 500 MG PO TB24
1500.0000 mg | ORAL_TABLET | Freq: Two times a day (BID) | ORAL | Status: DC
  Filled 2019-10-23: qty 3

## 2019-10-23 NOTE — ED Provider Notes (Addendum)
Patient seems to be waking up.  But not clear whether he is back to baseline or not but did have 3 seizures that were documented earlier today.  Patient has a note from neurology at [facility] with definitely implies he has seizure disorder.  Spoke with Dr. Laurence Slate neuro hospitalist on call.  Recommend loading him with Keppra okay for admission here at [facility] long.  Also recommend starting him on Keppra 500 mg twice daily.  As well as continuing his Depakote.  Head CT and head CT negative for any acute findings   Vanetta Mulders, MD 10/23/19 1807    Vanetta Mulders, MD 10/23/19 1807

## 2019-10-23 NOTE — Consult Note (Signed)
Requesting Physician: Dr.Doutova    Chief Complaint: [facility]  History obtained from: Patient and Chart     HPI:                                                                                                                                       David [name] is a 19 y.o. male with past medical history significant for limbic encephalitis, left temporal lobe necrosis, intellectual disability, seizures and nonepileptic spells presents to the emergency department at [facility] after having multiple seizures.  Patient received IM Versed in route.  Description of seizures limited, apparently had 3 seizures.  Postictal on arrival.  Patient remained somnolent in the emergency department.  Ammonia level elevated at 61.  Depakote level was 104.   While in the emergency department, patient appears to have had another spell witnessed by the hospitalist with with head version and gaze deviation to the the left.  The spell lasted for a few seconds and patient became responsive again.  Patient loaded with 1.5 g of Keppra.   Chart review from Dr. Opal Sidles notes on 09/25/19  Seizure Types (and frequency): 1. Focal Onset with impaired awareness with motor onset and focal progression to bilateral tonic/clonic activity. Comments: R facial jerking;.  Other Medical Problems:  1. Autism 2. Paroxysmal afib 3. 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  Prior Epilepsy Related Surgical Interventions: NA  Previous Anti-Seizure Meds: Divalproex ER 1260m BID Diazepam PRN Carbamazepine, level up to 121.3in 20865Eslicarbazepine 8784ON Current ASM/Relevant Meds: Divalproex ER 12588mBID  Imaging Studies: MRI brain wo contrast (08/25/19): extensive encephalomalacia L T lobe and asymmetric cortical atrophy of L F and P lobes MRI brain (08/20/07): nonspecific L T signal abnormality (15x20 mm with mild cystic component and mild restriction on diffusion; with  thickening and distortion of cortex), concern for dysplasia or DNET  EEG: EEG (04/25/17): essentially normal Video EEG (11/29-11/30/18): normal awake and sleep, one episode (not seen on video) of patient being limp with questionable convulsive activity with suboptimal recording on L EEG (04/24/18): normal Video EEG (11/27-11/28/19): B FT slowing with shifting asymmetry; reduced fast activity on L F EEG (06/16/18): attenuation of faster frequencies on L F Video EEG (08/24/19, 9 hours): diffuse slowing Video EEG (08/29/19, 2 hours): mild diffuse slowing and L T slowing  Brain biopsy (09/04/07): areas of necrotic tissue and dystrophic calcification with chronic inflammation and microglial activation Brain biopsy (12/06/07): extensive necrosis  Initial History of Present Illness:  He is accompanied today by David Gaulrom the GuSkylineShe has worked with him for the last year. She reports that he is currently living in a homeless shelter but she helps him with his medications. She feels that he is likely compliant.  Since discharge from the hospital he has had at least 3 seizures, on April 2, April 16  and over the last weekend.   Past Medical History:  Diagnosis Date  . Autism   . Intellectual disability    Moderate  . Limbic encephalitis   . Seizures ([place])   . Speech delay   . Thyroid disease     Past Surgical History:  Procedure Laterality Date  . BRAIN SURGERY     Brain biopsy  . GASTROSTOMY TUBE REVISION      History reviewed. No pertinent family history. Social History:  reports that he is a non-smoker but has been exposed to tobacco smoke. He has never used smokeless tobacco. He reports that he does not drink alcohol or use drugs.  Allergies:  Allergies  Allergen Reactions  . Penicillins Hives    DID THE REACTION INVOLVE: Swelling of the face/tongue/throat, SOB, or low BP? Yes Sudden or severe rash/hives, skin peeling, or the inside of the mouth or nose?  No Did it require medical treatment? No When did it last happen? Was an infant If all above answers are "NO", may proceed with cephalosporin use.    Medications:                                                                                                                        I reviewed home medications   ROS:                                                                                                                                     Limited review of systems due to patient's mental status   Examination:                                                                                                      General: Appears well-developed and well-nourished.  Psych: Affect appropriate to situation Eyes: No scleral injection HENT: No OP obstrucion Head: Normocephalic.  Cardiovascular: Normal rate and regular rhythm.  Respiratory: Effort normal and breath sounds normal to anterior ascultation GI: Soft.  No  distension. There is no tenderness.  Skin: WDI    Neurological Examination Mental Status: Somnolent oriented, thought content appropriate.  Speech fluent without evidence of aphasia. Able to follow 3 step commands without difficulty. Cranial Nerves: II: Visual fields grossly normal,  III,IV, VI: ptosis not present, extra-ocular motions intact bilaterally, pupils equal, round, reactive to light and accommodation V,VII: smile symmetric, facial light touch sensation normal bilaterally VIII: hearing normal bilaterally IX,X: uvula rises symmetrically XI: bilateral shoulder shrug XII: midline tongue extension Motor: Right : Upper extremity   5/5    Left:     Upper extremity   5/5  Lower extremity   5/5     Lower extremity   5/5 Tone and bulk:normal tone throughout; no atrophy noted Sensory: Pinprick and light touch intact throughout, bilaterally Plantars: Right: downgoing   Left: downgoing Cerebellar: normal finger-to-nose, normal rapid alternating movements and normal  heel-to-shin test Gait: normal gait and station     Lab Results: Basic Metabolic Panel: Recent Labs  Lab 10/18/19 1211 10/23/19 1320  NA 138 139  K 4.3 4.8  CL 103 102  CO2 28 29  GLUCOSE 131* 93  BUN 17 19  CREATININE 0.80 0.90  CALCIUM 9.6 9.2  MG  --  1.8    CBC: Recent Labs  Lab 10/18/19 1211 10/23/19 1320  WBC 3.9* 4.6  NEUTROABS  --  1.9  HGB 14.9 14.4  HCT 45.4 44.4  MCV 90.3 90.8  PLT 181 172    Coagulation Studies: No results for input(s): LABPROT, INR in the last 72 hours.  Imaging: CT Head Wo Contrast  Result Date: 10/23/2019 CLINICAL DATA:  Witnessed seizure EXAM: CT HEAD WITHOUT CONTRAST TECHNIQUE: Contiguous axial images were obtained from the base of the skull through the vertex without intravenous contrast. COMPARISON:  04/23/2018 FINDINGS: Brain: Chronic encephalomalacia within the left frontal and temporal regions with cortical calcifications, stable. No sign of acute infarct or hemorrhage. Lateral ventricles and midline structures are stable. No acute extra-axial fluid collections. No mass effect. Vascular: No hyperdense vessel or unexpected calcification. Skull: Postsurgical changes from prior left temporal craniotomy. No acute or destructive bony lesion. Sinuses/Orbits: No acute finding. Other: None. IMPRESSION: 1. 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 2. No acute intracranial process. Electronically Signed   By: Randa Ngo M.D.   On: 10/23/2019 16:01   DG CHEST PORT 1 VIEW  Result Date: 10/23/2019 CLINICAL DATA:  Seizure.  Acute encephalopathy. EXAM: PORTABLE CHEST 1 VIEW COMPARISON:  None. FINDINGS: Low lung volumes. Upper normal heart size likely accentuated by low lung volumes. There is bronchial vascular crowding. Minimal streaky retrocardiac atelectasis. No confluent airspace disease. No pulmonary edema, pleural effusion or pneumothorax. No acute osseous abnormalities are seen. IMPRESSION: 1. Low lung  volumes with bronchial vascular crowding. Streaky retrocardiac atelectasis. 2. Upper normal heart size likely accentuated by low lung volumes. Electronically Signed   By: Keith Rake M.D.   On: 10/23/2019 19:43     I have reviewed the above imaging : CTA shows significant chronic temporal scarring   ASSESSMENT AND PLAN   Continue male with history of NMDA encephalitis in 2009, left temporal lobe necrosis, focal epilepsy, p Afib.  Patient follows with [facility] appears that he has had 3 seizures in April.  Depakote level recently increased.  Patient has had multiple seizures, I do think it is reasonable to monitor on continuous EEG to make sure he is not having subclinical seizures.  Status epilepticus/recurrent  seizures Focal epilepsy Left temporal encephalomalacia H/o NMDA encephalitis   Recommendations Hold evening Depakote dose as VPA level supratherapeutic ammonia elevated at 61 Recheck ammonia and VPA levels tomorrow and restart Depakote Start Keppra 500 mg twice daily Seizure precautions Check for electrolyte and metabolic disturbances Transfer to [facility] for 24-hour EEG monitoring  Neurology will continue to follow patient   [place] Pager Number [phone]

## 2019-10-23 NOTE — ED Notes (Signed)
[facility] called for transportation. Paperwork at nurses station. 

## 2019-10-23 NOTE — ED Notes (Signed)
Per admitting doctor, do not transport patient. Bed assignment will be changed.

## 2019-10-23 NOTE — H&P (Addendum)
David [name] ZOX:[id number] DOB: [birth year] DOA: 10/23/2019     PCP: Patient, No Pcp Per   Outpatient Specialists:   Neu[facility] at [facility], Briscoe DeutscherSaurabh Ranjan, MD     Patient arrived to ER on 10/23/19 at 1231  Patient coming from: homeless Chief Complaint: Chief Complaint  Patient presents with  . Seizures    HPI: David [name] is a [age] y.o. male with medical history significant of ASD, seizure disorders and pseudoseizures followed by [facility]    Presented with   patient had "shaking episode" He was seen in the emergency department on 24 May with a shaking episode while at shelter he has had had previous ER visits for the same.  On 24th he was not postictal was given a dose of Keppra and was able to be discharged  Today he had another episode while at [facility] was witnessed lasting for 3 to 5 minutes EMS was called patient had another episode while with EMS and was given 5 mg of Versed He remained sedated thereafter with possibly postictal.   Infectious risk factors:  Reports none    Has  Not been vaccinated against COVID   In  ER  COVID TEST  NEGATIVE   Lab Results  Component Value Date   SARSCOV2NAA NEGATIVE 10/23/2019   SARSCOV2NAA NEGATIVE 10/05/2019   SARSCOV2NAA NEGATIVE 09/27/2019    Regarding pertinent Chronic problems:   History of seizure disorder on Depakote   While in ER: Patient remained postictal for quite some time Case was discussed with neurology per his recommendation patient was loaded up with Keppra 1500 IV Depakote level was noted to be somewhat elevated ammonia level slightly elevated 66 Per neurology patient is okay to be admitted [facility] at this time While in ER pt continued to have occasional episodes of shakes and deviated gaze but seemed to be responsive there after and during the event, no worsening postictal state and no urinary incontinence, more consistent with pseudoseizures   ER Provider Called: Neurology    Dr. Laurence SlateAroor They  Recommend admit to medicine   Will  see in AM   Hospitalist was called for admission for recurrent seizures  The following Work up has been ordered so far:  Orders Placed This Encounter  Procedures  . Urine culture  . SARS Coronavirus 2 by RT PCR (hospital order, [facility]) Nasopharyngeal Nasopharyngeal Swab  . CT Head Wo Contrast  . Urinalysis, Routine w reflex microscopic  . Ammonia  . Diet regular Room service appropriate? Yes; Fluid consistency: Thin  . Cardiac monitoring  . Consult to Neuro Hospitalist  ALL PATIENTS BEING ADMITTED/HAVING PROCEDURES NEED COVID-19 SCREENING  . Consult to hospitalist  ALL PATIENTS BEING ADMITTED/HAVING PROCEDURES NEED COVID-19 SCREENING  . ED EKG    Following Medications were ordered in ER: Medications  levETIRAcetam (KEPPRA) IVPB 1500 mg/ 100 mL premix (1,500 mg Intravenous New Bag/Given 10/23/19 1839)        Consult Orders  (From admission, onward)         Start     Ordered   10/23/19 1805  Consult to hospitalist  ALL PATIENTS BEING ADMITTED/HAVING PROCEDURES NEED COVID-19 SCREENING  Once    Comments: ALL PATIENTS BEING ADMITTED/HAVING PROCEDURES NEED COVID-19 SCREENING  Provider:  (Not yet assigned)  Question Answer Comment  Place call to: [facility] Seizure post ictal [id number]   Reason for Consult Admit      10/23/19 1805   10/23/19 1713  Consult to Neuro Hospitalist  ALL PATIENTS BEING ADMITTED/HAVING PROCEDURES NEED COVID-19 SCREENING  Once    Comments: ALL PATIENTS BEING ADMITTED/HAVING PROCEDURES NEED COVID-19 SCREENING  Provider:  (Not yet assigned)  Question Answer Comment  Place call to: oncall Neuro Hospitalist [phone]   Reason for Consult Consult      10/23/19 1712         Significant initial  Findings: Abnormal Labs Reviewed  AMMONIA - Abnormal; Notable for the following components:      Result Value   Ammonia 61 (*)    All other components within normal limits     Otherwise labs  showing:    Recent Labs  Lab 10/18/19 1211 10/23/19 1320  NA 138 139  K 4.3 4.8  CO2 28 29  GLUCOSE 131* 93  BUN 17 19  CREATININE 0.80 0.90  CALCIUM 9.6 9.2  MG  --  1.8    Cr  stable,    Lab Results  Component Value Date   CREATININE 0.90 10/23/2019   CREATININE 0.80 10/18/2019   CREATININE 0.87 10/14/2019    Recent Labs  Lab 10/18/19 1211 10/23/19 1320  AST 26 29  ALT 18 21  ALKPHOS 73 64  BILITOT 0.6 0.6  PROT 7.7 7.4  ALBUMIN 3.9 3.8   Lab Results  Component Value Date   CALCIUM 9.2 10/23/2019     WBC      Component Value Date/Time   WBC 4.6 10/23/2019 1320   ANC    Component Value Date/Time   NEUTROABS 1.9 10/23/2019 1320   ALC No components found for: LYMPHAB    Plt: Lab Results  Component Value Date   PLT 172 10/23/2019      Lab Results  Component Value Date   SARSCOV2NAA NEGATIVE 10/23/2019   SARSCOV2NAA NEGATIVE 10/05/2019   SARSCOV2NAA NEGATIVE 09/27/2019    HG/HCT  Stable     Component Value Date/Time   HGB 14.4 10/23/2019 1320   HGB 14.8 05/03/2018 1100   HCT 44.4 10/23/2019 1320   HCT 45.0 05/03/2018 1100    No results for input(s): LIPASE, AMYLASE in the last 168 hours. Recent Labs  Lab 10/23/19 1447  AMMONIA 61*    No components found for: LABALBU    ECG: Ordered Personally reviewed by me showing: HR : 93 Rhythm: NSR,     no evidence of ischemic changes QTC 410    UA   no evidence of UTI       Ordered  CT HEADStable chronic encephalomalacia  CXR - Low lung volumes with bronchial vascular crowding.      ED Triage Vitals  Enc Vitals Group     BP 10/23/19 1429 (!) 117/58     Pulse Rate 10/23/19 1412 77     Resp 10/23/19 1445 18     Temp 10/23/19 1429 98.8 F (37.1 C)     Temp Source 10/23/19 1429 Oral     SpO2 10/23/19 1412 100 %     Weight 10/23/19 1429 182 lb 15.7 oz (83 kg)     Height 10/23/19 1429 5\' 4"  (1.626 m)     Head Circumference --      Peak Flow --      Pain Score 10/23/19 1429 0       Pain Loc --      Pain Edu? --      Excl. in GC? --   TMAX(24)@       Latest  Blood pressure 105/69, pulse  63, temperature 98.8 F (37.1 C), temperature source Oral, resp. rate 13, height 5\' 4"  (1.626 m), weight 83 kg, SpO2 99 %.  Review of Systems:    Pertinent positives include: seizure,  confusion  Constitutional:  No weight loss, night sweats, Fevers, chills, fatigue, weight loss  HEENT:  No headaches, Difficulty swallowing,Tooth/dental problems,Sore throat,  No sneezing, itching, ear ache, nasal congestion, post nasal drip,  Cardio-vascular:  No chest pain, Orthopnea, PND, anasarca, dizziness, palpitations.no Bilateral lower extremity swelling  GI:  No heartburn, indigestion, abdominal pain, nausea, vomiting, diarrhea, change in bowel habits, loss of appetite, melena, blood in stool, hematemesis Resp:  no shortness of breath at rest. No dyspnea on exertion, No excess mucus, no productive cough, No non-productive cough, No coughing up of blood.No change in color of mucus.No wheezing. Skin:  no rash or lesions. No jaundice GU:  no dysuria, change in color of urine, no urgency or frequency. No straining to urinate.  No flank pain.  Musculoskeletal:  No joint pain or no joint swelling. No decreased range of motion. No back pain.  Psych:  No change in mood or affect. No depression or anxiety. No memory loss.  Neuro: no localizing neurological complaints, no tingling, no weakness, no double vision, no gait abnormality, no slurred speech, no  All systems reviewed and apart from HOPI all are negative  Past Medical History:   Past Medical History:  Diagnosis Date  . Autism   . Intellectual disability    Moderate  . Limbic encephalitis   . Seizures (HCC)   . Speech delay   . Thyroid disease      Past Surgical History:  Procedure Laterality Date  . BRAIN SURGERY     Brain biopsy  . GASTROSTOMY TUBE REVISION      Social History:  Ambulatory   Independently      reports that he is a non-smoker but has been exposed to tobacco smoke. He has never used smokeless tobacco. He reports that he does not drink alcohol or use drugs.     Family History: Unable to provide  Allergies: Allergies  Allergen Reactions  . Penicillins Hives    DID THE REACTION INVOLVE: Swelling of the face/tongue/throat, SOB, or low BP? Yes Sudden or severe rash/hives, skin peeling, or the inside of the mouth or nose? No Did it require medical treatment? No When did it last happen? Was an infant If all above answers are "NO", may proceed with cephalosporin use.     Prior to Admission medications   Medication Sig Start Date End Date Taking? Authorizing Provider  cholecalciferol (VITAMIN D3) 25 MCG (1000 UNIT) tablet Take 1,000 Units by mouth daily.    [provider]  divalproex (DEPAKOTE ER) 500 MG 24 hr tablet Take 1,500 mg by mouth 2 (two) times daily. 09/25/19   [provider]  Eslicarbazepine Acetate 800 MG TABS Take 800 mg by mouth daily with supper. Patient not taking: Reported on 09/27/2019 06/16/18   06/18/18, PA-C  levETIRAcetam (KEPPRA) 1000 MG/100ML SOLN Inject 100 mLs (1,000 mg total) into the vein 2 (two) times daily. Patient not taking: Reported on 06/07/2018 04/24/18   04/26/18, MD  levothyroxine (SYNTHROID) 125 MCG tablet Take 0.5 tablets (62.5 mcg total) by mouth daily. 09/29/19   11/29/19, NP  risperiDONE (RISPERDAL) 0.5 MG tablet Take 1 tablet (0.5 mg total) by mouth 2 (two) times daily. 10/06/19   12/06/19, MD   Physical Exam: Blood pressure 105/69,  pulse 63, temperature 98.8 F (37.1 C), temperature source Oral, resp. rate 13, height 5\' 4"  (1.626 m), weight 83 kg, SpO2 99 %. 1. General:  in No  Acute distress   well -appearing 2. Psychological: Alert and  Oriented 3. Head/ENT:     Dry Mucous Membranes                          Head Non traumatic, neck supple                          Poor Dentition 4. SKIN:    decreased Skin turgor,  Skin clean Dry and intact no rash 5. Heart: Regular rate and rhythm no Murmur, no Rub or gallop 6. Lungs:   no wheezes or crackles   7. Abdomen: Soft,  Non distended  bowel sounds present 8. Lower extremities: no clubbing, cyanosis, no edema 9. Neurologically Grossly intact, moving all 4 extremities equally  10. MSK: Normal range of motion   All other LABS:     Recent Labs  Lab 10/18/19 1211 10/23/19 1320  WBC 3.9* 4.6  NEUTROABS  --  1.9  HGB 14.9 14.4  HCT 45.4 44.4  MCV 90.3 90.8  PLT 181 172     Recent Labs  Lab 10/18/19 1211 10/23/19 1320  NA 138 139  K 4.3 4.8  CL 103 102  CO2 28 29  GLUCOSE 131* 93  BUN 17 19  CREATININE 0.80 0.90  CALCIUM 9.6 9.2  MG  --  1.8     Recent Labs  Lab 10/18/19 1211 10/23/19 1320  AST 26 29  ALT 18 21  ALKPHOS 73 64  BILITOT 0.6 0.6  PROT 7.7 7.4  ALBUMIN 3.9 3.8       Cultures: No results found for: SDES, SPECREQUEST, CULT, REPTSTATUS   Radiological Exams on Admission: CT Head Wo Contrast  Result Date: 10/23/2019 CLINICAL DATA:  Witnessed seizure EXAM: CT HEAD WITHOUT CONTRAST TECHNIQUE: Contiguous axial images were obtained from the base of the skull through the vertex without intravenous contrast. COMPARISON:  04/23/2018 FINDINGS: Brain: Chronic encephalomalacia within the left frontal and temporal regions with cortical calcifications, stable. No sign of acute infarct or hemorrhage. Lateral ventricles and midline structures are stable. No acute extra-axial fluid collections. No mass effect. Vascular: No hyperdense vessel or unexpected calcification. Skull: Postsurgical changes from prior left temporal craniotomy. No acute or destructive bony lesion. Sinuses/Orbits: No acute finding. Other: None. IMPRESSION: 1. 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 2. No acute intracranial process. Electronically Signed   By: 04/25/2018 M.D.   On: 10/23/2019 16:01     Chart has been reviewed    Assessment/Plan  19 y.o. male with medical history significant of ASD, seizure disorders and pseudoseizures followed by [facility] Admitted for recurrent seizures  Present on Admission:  recurrent seizures initially loaded with Keppra will  Hold tonight dose of Depakote continue Keppra twice daily appreciate neurology input will order seizure precautions patient's postictal state is improving. May need continuous EEG given hx of  Recheck ammonia in AM Given left temporal encephalomalacia pt is likely prone to repeated episodes, Appreciate neurology consult.  Homelessness - will need transitional care consult prior to discharge  . Hypothyroidism - - Check TSH continue home medications at current dose  ASD - chronic stable pt has legal guardian  Other plan as per orders.  DVT prophylaxis:  SCD     Code Status:  FULL CODE   Family Communication:   Family not at  Bedside    Disposition Plan:     To home once workup is complete and patient is stable   Following barriers for discharge:                             Seizure free, mentation back to baseline                             Will need to be able to tolerate PO                                                        Will need consultants to evaluate patient prior to discharge   [place] tomorrow                      Transition of care consulted                                     Consults called: Neurology aware  Admission status:  ED Disposition    ED Disposition Condition [place]: [place] [100102]  Level of Care: Stepdown [14]  Admit to SDU based on following criteria: Severe physiological/psychological symptoms:  Any diagnosis requiring assessment & intervention at least every 4 hours on an ongoing basis to obtain desired patient outcomes including stability and rehabilitation  Admit to SDU based on following criteria: Other see comments   Comments: reccurent seizures  Covid Evaluation: Confirmed COVID Negative  Diagnosis: Recurrent seizures ([place]) [[id number]]  Admitting Physician: Toy Baker [[id number]]  Attending Physician: Toy Baker [[id number]]        Obs      Level of care     tele  For  24H     Precautions: admitted as Covid Negative  PPE: Used by the provider:   P100  eye Goggles,  Gloves    Marci Polito 10/23/2019, 8:57 PM    [facility]     after 2 AM please page floor coverage PA If 7AM-7PM, please contact the day team taking care of the patient using [email or web address]   Patient was evaluated in the context of the global COVID-19 pandemic, which necessitated consideration that the patient might be at risk for infection with the SARS-CoV-2 virus that causes COVID-19. Institutional protocols and algorithms that pertain to the evaluation of patients at risk for COVID-19 are in a state of rapid change based on information released by regulatory bodies including the CDC and federal and state organizations. These policies and algorithms were followed during the patient's care.

## 2019-10-23 NOTE — ED Provider Notes (Signed)
[place] DEPT Provider Note   CSN: [id number] Arrival date & time: 10/23/19  1231  LEVEL 5 CAVEAT - ALTERED MENTAL STATUS   History Chief Complaint  Patient presents with  . Seizures    David [name] is a 19 y.o. male.  HPI 19 year old male presents with seizures.  History is by nurse as EMS is no longer present and the patient is asleep.  Patient apparently had a seizure at [employer].  He has a known history of seizures but also pseudoseizures.  EMS reported to nurse that the patient had a focal seizure on their ambulance and then another seizure on arrival to the ED.  Gave him 5 mg IM Versed.  Further history is very limited.   Past Medical History:  Diagnosis Date  . Autism   . Intellectual disability    Moderate  . Limbic encephalitis   . Seizures ([place])   . Speech delay   . Thyroid disease     Patient Active Problem List   Diagnosis Date Noted  . 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 Seizure disorder ([place]) 04/24/2018  . Seizure ([place]) 04/24/2017  . Acne 11/03/2016  . Depression 11/03/2016  . Intellectual disability 11/03/2016  . 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 Epilepsy ([place]) 11/03/2016  . 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 Hypothyroidism 04/02/2008  . RHINITIS, ALLERGIC 07/26/2006    Past Surgical History:  Procedure Laterality Date  . BRAIN SURGERY     Brain biopsy  . GASTROSTOMY TUBE REVISION         No family history on file.  Social History   Tobacco Use  . Smoking status: Passive Smoke Exposure - Never Smoker  . Smokeless tobacco: Never Used  Substance Use Topics  . Alcohol use: No  . Drug use: No    Home Medications Prior to Admission medications   Medication Sig Start Date End Date Taking? Authorizing Provider  cholecalciferol (VITAMIN D3) 25 MCG (1000 UNIT) tablet Take 1,000 Units by mouth daily.    [provider]  divalproex (DEPAKOTE ER) 500 MG 24 hr tablet Take 1,500 mg by mouth 2 (two) times daily. 09/25/19   [provider]  Eslicarbazepine Acetate 800 MG TABS Take 800 mg by mouth daily with supper. Patient not taking: Reported on 09/27/2019 06/16/18   Dalia Heading, PA-C  levETIRAcetam (KEPPRA) 1000 MG/100ML SOLN Inject 100 mLs (1,000 mg total) into the vein 2 (two) times daily. Patient not taking: Reported on 06/07/2018 04/24/18   Jonetta Osgood, MD  levothyroxine (SYNTHROID) 125 MCG tablet Take 0.5 tablets (62.5 mcg total) by mouth daily. 09/29/19   Patrecia Pour, NP  risperiDONE (RISPERDAL) 0.5 MG tablet Take 1 tablet (0.5 mg total) by mouth 2 (two) times daily. 10/06/19   Dorie Rank, MD    Allergies    Penicillins  Review of Systems   Review of Systems  Unable to perform ROS: Mental status change    Physical Exam Updated Vital Signs BP (!) 117/58 (BP Location: Left Arm)   Pulse 77   Temp 98.8 F (37.1 C) (Oral)   Ht 5\' 4"  (1.626 m)   Wt 83 kg   SpO2 98%   BMI 31.41 kg/m   Physical Exam Vitals and nursing note reviewed.  Constitutional:      Appearance: He is well-developed.  HENT:     Head: Normocephalic and atraumatic.     Right Ear:  External ear normal.     Left Ear: External ear normal.     Nose: Nose normal.  Eyes:     General:        Right eye: No discharge.        Left eye: No discharge.     Pupils: Pupils are equal, round, and reactive to light.  Cardiovascular:     Rate and Rhythm: Normal rate and regular rhythm.     Heart sounds: Normal heart sounds.  Pulmonary:     Effort: Pulmonary effort is normal.     Breath sounds: Normal breath sounds.  Abdominal:     Palpations: Abdomen is soft.     Tenderness: There is no abdominal tenderness.  Musculoskeletal:     Cervical back: Full passive range of motion without pain and neck supple. No rigidity.  Skin:    General: Skin is warm and dry.  Neurological:     Comments: Patient is asleep. When  I stimulate him to wake him up he will briefly open eyes and then go back to sleep. Weakly moves all 4 extremities on command  Psychiatric:        Mood and Affect: Mood is not anxious.     ED Results / Procedures / Treatments   Labs (all labs ordered are listed, but only abnormal results are displayed) Labs Reviewed  URINE CULTURE  SARS CORONAVIRUS 2 BY RT PCR [facility] ORDER, PERFORMED IN [facility] LAB)  URINALYSIS, ROUTINE W REFLEX MICROSCOPIC  AMMONIA    EKG EKG Interpretation  Date/Time:  Thursday Oct 23 2019 15:00:34 EDT Ventricular Rate:  93 PR Interval:    QRS Duration: 92 QT Interval:  329 QTC Calculation: 410 R Axis:   69 Text Interpretation: Sinus rhythm No significant change since last tracing Confirmed by Vanetta Mulders [phone]) on 10/23/2019 3:14:17 PM   Radiology No results found.  Procedures Procedures (including critical care time)  Medications Ordered in ED Medications - No data to display  ED Course  I have reviewed the triage vital signs and the nursing notes.  Pertinent labs & imaging results that were available during my care of the patient were reviewed by me and considered in my medical decision making (see chart for details).    MDM Rules/Calculators/A&P                      Patient's labs are overall unremarkable save for mildly elevated Depakote level.  Given this, we will add on pneumonia.  He remains very sleepy and minimally wakes up and follows commands.  This has been prolonged and he is now finally starting to become a little more awake.  Likely related to Versed plus postictal state.  He does seem to actually have a seizure disorder in addition to pseudoseizures.  Given 3 apparent real seizures today he will probably need admission.  CT head and ammonia level pending.  Care to Dr. Deretha Emory. Final Clinical Impression(s) / ED Diagnoses Final diagnoses:  None    Rx / DC Orders ED Discharge Orders    None        Pricilla Loveless, MD 10/23/19 1515

## 2019-10-23 NOTE — ED Triage Notes (Signed)
Arrives via EMS from [employer], C/C witnessed seizure for 3-5 min. Had another seizure with EMS and one upon arrival. EMS gave 5 mg Versed.

## 2019-10-24 ENCOUNTER — Encounter (HOSPITAL_COMMUNITY): Payer: Self-pay | Admitting: Internal Medicine

## 2019-10-24 ENCOUNTER — Observation Stay (HOSPITAL_COMMUNITY): Payer: Medicaid Other

## 2019-10-24 DIAGNOSIS — Y92009 Unspecified place in unspecified non-institutional (private) residence as the place of occurrence of the external cause: Secondary | ICD-10-CM | POA: Diagnosis not present

## 2019-10-24 DIAGNOSIS — Z7722 Contact with and (suspected) exposure to environmental tobacco smoke (acute) (chronic): Secondary | ICD-10-CM | POA: Diagnosis present

## 2019-10-24 DIAGNOSIS — E7220 Disorder of urea cycle metabolism, unspecified: Secondary | ICD-10-CM | POA: Diagnosis present

## 2019-10-24 DIAGNOSIS — Z79899 Other long term (current) drug therapy: Secondary | ICD-10-CM | POA: Diagnosis not present

## 2019-10-24 DIAGNOSIS — F809 Developmental disorder of speech and language, unspecified: Secondary | ICD-10-CM | POA: Diagnosis present

## 2019-10-24 DIAGNOSIS — Z880 Allergy status to penicillin: Secondary | ICD-10-CM | POA: Diagnosis not present

## 2019-10-24 DIAGNOSIS — G40111 Localization-related (focal) (partial) symptomatic epilepsy and epileptic syndromes with simple partial seizures, intractable, with status epilepticus: Secondary | ICD-10-CM | POA: Diagnosis not present

## 2019-10-24 DIAGNOSIS — E038 Other specified hypothyroidism: Secondary | ICD-10-CM | POA: Diagnosis present

## 2019-10-24 DIAGNOSIS — Z5329 Procedure and treatment not carried out because of patient's decision for other reasons: Secondary | ICD-10-CM | POA: Diagnosis not present

## 2019-10-24 DIAGNOSIS — Z8661 Personal history of infections of the central nervous system: Secondary | ICD-10-CM | POA: Diagnosis not present

## 2019-10-24 DIAGNOSIS — G9340 Encephalopathy, unspecified: Secondary | ICD-10-CM | POA: Diagnosis not present

## 2019-10-24 DIAGNOSIS — G40909 Epilepsy, unspecified, not intractable, without status epilepticus: Secondary | ICD-10-CM | POA: Diagnosis not present

## 2019-10-24 DIAGNOSIS — F840 Autistic disorder: Secondary | ICD-10-CM | POA: Diagnosis present

## 2019-10-24 DIAGNOSIS — Z20822 Contact with and (suspected) exposure to covid-19: Secondary | ICD-10-CM | POA: Diagnosis present

## 2019-10-24 DIAGNOSIS — Z6831 Body mass index (BMI) 31.0-31.9, adult: Secondary | ICD-10-CM | POA: Diagnosis not present

## 2019-10-24 DIAGNOSIS — E039 Hypothyroidism, unspecified: Secondary | ICD-10-CM | POA: Diagnosis not present

## 2019-10-24 DIAGNOSIS — F79 Unspecified intellectual disabilities: Secondary | ICD-10-CM | POA: Diagnosis present

## 2019-10-24 DIAGNOSIS — Z79890 Hormone replacement therapy (postmenopausal): Secondary | ICD-10-CM | POA: Diagnosis not present

## 2019-10-24 DIAGNOSIS — Q211 Atrial septal defect: Secondary | ICD-10-CM | POA: Diagnosis not present

## 2019-10-24 DIAGNOSIS — R739 Hyperglycemia, unspecified: Secondary | ICD-10-CM | POA: Diagnosis present

## 2019-10-24 DIAGNOSIS — R569 Unspecified convulsions: Secondary | ICD-10-CM | POA: Diagnosis not present

## 2019-10-24 DIAGNOSIS — Z590 Homelessness: Secondary | ICD-10-CM | POA: Diagnosis not present

## 2019-10-24 DIAGNOSIS — T426X5A Adverse effect of other antiepileptic and sedative-hypnotic drugs, initial encounter: Secondary | ICD-10-CM | POA: Diagnosis present

## 2019-10-24 DIAGNOSIS — E8809 Other disorders of plasma-protein metabolism, not elsewhere classified: Secondary | ICD-10-CM | POA: Diagnosis present

## 2019-10-24 DIAGNOSIS — E669 Obesity, unspecified: Secondary | ICD-10-CM | POA: Diagnosis present

## 2019-10-24 DIAGNOSIS — I480 Paroxysmal atrial fibrillation: Secondary | ICD-10-CM | POA: Diagnosis present

## 2019-10-24 DIAGNOSIS — G9389 Other specified disorders of brain: Secondary | ICD-10-CM | POA: Diagnosis present

## 2019-10-24 LAB — COMPREHENSIVE METABOLIC PANEL
ALT: 21 U/L (ref 0–44)
AST: 25 U/L (ref 15–41)
Albumin: 3.3 g/dL — ABNORMAL LOW (ref 3.5–5.0)
Alkaline Phosphatase: 59 U/L (ref 38–126)
Anion gap: 6 (ref 5–15)
BUN: 15 mg/dL (ref 6–20)
CO2: 26 mmol/L (ref 22–32)
Calcium: 9.1 mg/dL (ref 8.9–10.3)
Chloride: 103 mmol/L (ref 98–111)
Creatinine, Ser: 0.87 mg/dL (ref 0.61–1.24)
GFR calc Af Amer: >60 mL/min (ref >60)
GFR calc non Af Amer: >60 mL/min (ref >60)
Glucose, Bld: 112 mg/dL — ABNORMAL HIGH (ref 70–99)
Potassium: 4 mmol/L (ref 3.5–5.1)
Sodium: 135 mmol/L (ref 135–145)
Total Bilirubin: 0.6 mg/dL (ref 0.3–1.2)
Total Protein: 6.6 g/dL (ref 6.5–8.1)

## 2019-10-24 LAB — MAGNESIUM: Magnesium: 1.6 mg/dL — ABNORMAL LOW (ref 1.7–2.4)

## 2019-10-24 LAB — CBC WITH DIFFERENTIAL/PLATELET
Abs Immature Granulocytes: 0.01 10*3/uL (ref 0.00–0.07)
Basophils Absolute: 0 10*3/uL (ref 0.0–0.1)
Basophils Relative: 1 %
Eosinophils Absolute: 0.2 10*3/uL (ref 0.0–0.5)
Eosinophils Relative: 4 %
HCT: 44.5 % (ref 39.0–52.0)
Hemoglobin: 14.4 g/dL (ref 13.0–17.0)
Immature Granulocytes: 0 %
Lymphocytes Relative: 45 %
Lymphs Abs: 1.9 10*3/uL (ref 0.7–4.0)
MCH: 28.9 pg (ref 26.0–34.0)
MCHC: 32.4 g/dL (ref 30.0–36.0)
MCV: 89.4 fL (ref 80.0–100.0)
Monocytes Absolute: 0.5 10*3/uL (ref 0.1–1.0)
Monocytes Relative: 12 %
Neutro Abs: 1.6 10*3/uL — ABNORMAL LOW (ref 1.7–7.7)
Neutrophils Relative %: 38 %
Platelets: 169 10*3/uL (ref 150–400)
RBC: 4.98 MIL/uL (ref 4.22–5.81)
RDW: 12.8 % (ref 11.5–15.5)
WBC: 4.3 10*3/uL (ref 4.0–10.5)
nRBC: 0 % (ref 0.0–0.2)

## 2019-10-24 LAB — TSH: TSH: 4.314 u[IU]/mL (ref 0.350–4.500)

## 2019-10-24 LAB — HIV ANTIBODY (ROUTINE TESTING W REFLEX): HIV Screen 4th Generation wRfx: NONREACTIVE

## 2019-10-24 LAB — AMMONIA: Ammonia: 56 umol/L — ABNORMAL HIGH (ref 9–35)

## 2019-10-24 LAB — URINALYSIS, ROUTINE W REFLEX MICROSCOPIC
Bilirubin Urine: NEGATIVE
Glucose, UA: NEGATIVE mg/dL
Hgb urine dipstick: NEGATIVE
Ketones, ur: NEGATIVE mg/dL
Leukocytes,Ua: NEGATIVE
Nitrite: NEGATIVE
Protein, ur: NEGATIVE mg/dL
Specific Gravity, Urine: 1.027 (ref 1.005–1.030)
pH: 6 (ref 5.0–8.0)

## 2019-10-24 LAB — GLUCOSE, CAPILLARY: Glucose-Capillary: 93 mg/dL (ref 70–99)

## 2019-10-24 LAB — PHOSPHORUS: Phosphorus: 4.8 mg/dL — ABNORMAL HIGH (ref 2.5–4.6)

## 2019-10-24 LAB — VALPROIC ACID LEVEL: Valproic Acid Lvl: 74 ug/mL (ref 50.0–100.0)

## 2019-10-24 MED ORDER — ENOXAPARIN SODIUM 40 MG/0.4ML ~~LOC~~ SOLN
40.0000 mg | SUBCUTANEOUS | Status: DC
  Administered 2019-10-24 – 2019-10-27 (×2): 40 mg via SUBCUTANEOUS
  Filled 2019-10-24 (×3): qty 0.4

## 2019-10-24 MED ORDER — MAGNESIUM SULFATE 2 GM/50ML IV SOLN
2.0000 g | Freq: Once | INTRAVENOUS | Status: AC
  Administered 2019-10-24: 2 g via INTRAVENOUS
  Filled 2019-10-24: qty 50

## 2019-10-24 MED ORDER — LORAZEPAM 2 MG/ML IJ SOLN
INTRAMUSCULAR | Status: AC
  Filled 2019-10-24: qty 1

## 2019-10-24 MED ORDER — LORAZEPAM 2 MG/ML IJ SOLN: 2.0000 mg | INTRAMUSCULAR | Status: DC | PRN

## 2019-10-24 NOTE — Procedures (Signed)
Patient Name: David [name]  MRN: [id number]  Epilepsy Attending: Charlsie Quest  Referring Physician/Provider: Dr. Raj Janus Date: 10/24/2019 Duration: 28.59 minutes  Patient history: 19 year old male with history of medically refractory epilepsy presented with breakthrough seizures.  EEG evaluate for seizures.  Level of alertness: Awake  AEDs during EEG study: Keppra  Technical aspects: This EEG study was done with scalp electrodes positioned according to the 10-20 International system of electrode placement. Electrical activity was acquired at a sampling rate of 500Hz  and reviewed with a high frequency filter of 70Hz  and a low frequency filter of 1Hz . EEG data were recorded continuously and digitally stored.   Description: The posterior dominant rhythm consists of 9 Hz activity of moderate voltage (25-35 uV) seen predominantly in posterior head regions, symmetric and reactive to eye opening and eye closing. Hyperventilation did not show any EEG change.  Physiology photic driving was not seen during photic stimulation.    IMPRESSION: This study is within normal limits. No seizures or epileptiform discharges were seen throughout the recording.  Clent Damore 

## 2019-10-24 NOTE — TOC Initial Note (Signed)
Transition of Care [facility]) - Initial/Assessment Note    Patient Details  Name: David [name] MRN: [id number] Date of Birth: 09-22-2000  Transition of [facility]) CM/SW Contact:    Kermit Balo, RN Phone Number: 10/24/2019, 12:47 PM  Clinical Narrative:                 CM called and spoke to [facility] pts POA through DSS. She states the patient will return to [facility] at d/c. She states he will need to transport there via cab.  She states that he has all previously ordered medications available at the shelter. She asked that any new medications be filled by [facility] pharmacy today and delivered to the room for the patient to have over the weekend. They would not be able to get any new medications filled over the holiday weekend. CM updated the MD.  TOC following.  Expected Discharge Plan: Homeless Shelter Barriers to Discharge: Continued Medical Work up   Patient Goals and CMS Choice        Expected Discharge Plan and Services Expected Discharge Plan: Homeless Shelter   Discharge Planning Services: CM Consult   Living arrangements for the past 2 months: Homeless Shelter                                      Prior Living Arrangements/Services Living arrangements for the past 2 months: Homeless Shelter   Patient language and need for interpreter reviewed:: Yes Do you feel safe going back to the place where you live?: Yes      Need for Family Participation in Patient Care: No (Comment) Care giver support system in place?: Yes (comment)   Criminal Activity/Legal Involvement Pertinent to Current Situation/Hospitalization: No - Comment as needed  Activities of Daily Living Home Assistive Devices/Equipment: None ADL Screening (condition at time of admission) Patient's cognitive ability adequate to safely complete daily activities?: No(pt currently unable to answer questions) Is the patient deaf or have difficulty hearing?: No Does the patient have difficulty  seeing, even when wearing glasses/contacts?: No Does the patient have difficulty concentrating, remembering, or making decisions?: Yes(currently unable to answer questions) Patient able to express need for assistance with ADLs?: No(currently unable to answer questions) Does the patient have difficulty dressing or bathing?: No Independently performs ADLs?: Yes (appropriate for developmental age) Does the patient have difficulty walking or climbing stairs?: No Weakness of Legs: None Weakness of Arms/Hands: None  Permission Sought/Granted                  Emotional Assessment Appearance:: Appears stated age         Psych Involvement: No (comment)  Admission diagnosis:  Seizure (HCC) [R56.9] Recurrent seizures (HCC) [G40.909] Acute encephalopathy [G93.40] Patient Active Problem List   Diagnosis Date Noted  . Recurrent seizures (HCC) 10/23/2019  . 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 Seizure disorder (HCC) 04/24/2018  . Seizure (HCC) 04/24/2017  . Acne 11/03/2016  . Depression 11/03/2016  . Intellectual disability 11/03/2016  . 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 Hypothyroidism 04/02/2008  . RHINITIS, ALLERGIC 07/26/2006   PCP:  Patient, No Pcp Per Pharmacy:   [facility] [address] [place] - [address] [place] [address] Phone: [phone] Fax: [phone]  Social Determinants of Health (SDOH) Interventions    Readmission Risk Interventions No flowsheet data found.

## 2019-10-24 NOTE — Progress Notes (Signed)
EEG complete - results pending 

## 2019-10-24 NOTE — Care Plan (Signed)
Event button was pressed for seizure like activity. LTM reviewed, no eeg change, this was a non epileptic event. RN notified.    David [name] [name]

## 2019-10-24 NOTE — Progress Notes (Signed)
Pt had two seizure like episodes, one at 1346 lasting until 1350 without decreased consciousness but without motor activity and a second episode at 1353 until 1357 with a motor component of pt turn on to left side.  Attending Dr. Windell Norfolk, MD and Dr. Melynda Ripple, MD paged who both came bedside to assess the patient. Both providers indicated that episodes were not seizures.  Will continue to monitor.

## 2019-10-24 NOTE — Plan of Care (Signed)

## 2019-10-24 NOTE — Progress Notes (Signed)
PROGRESS NOTE        PATIENT DETAILS Name: David [name] Age: 19 y.o. Sex: male Date of Birth: [birth year] Admit Date: 10/23/2019 Admitting Physician Therisa Doyne, MD OAC:[id number], No Pcp Per  Brief Narrative: Patient is a 19 y.o. male with history of seizures, autism, hypothyroidism, anti-NMDA receptor encephalitis in 2009-presented to the hospital with breakthrough seizures.  Significant events: 5/27>> presented to [facility] with breakthrough seizures-transfer to [facility]  Significant studies: 5/27: CT head without contrast>> no acute intracranial process, stable chronic encephalomalacia within the left frontal and temporal regions.  Antimicrobial therapy: None  Microbiology data: None  Procedures : 5/28: LTM EEG>> in progress  Consults: Neurology  DVT Prophylaxis : Prophylactic Lovenox   Subjective: Smiling in bed-denies any chest pain or shortness of breath.  Follows commands.  Speaks only a few words.  Assessment/Plan: Seizure disorder with numerous breakthrough seizures: LTM EEG in progress-continue Keppra-we will discuss with neurology whether we can resume Depakote at previous dose or dose needs to be adjusted given that levels were supratherapeutic when he first presented.  Hypothyroidism: Continue Synthroid.  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   Weight as of this encounter: 83 kg.    Diet: Diet Order            Diet regular Room service appropriate? Yes; Fluid consistency: Thin  Diet effective now               Code Status: Full code  Family Communication: None/Spouse at bedside  Disposition Plan: Home vs Home  with Home health vs SNF when ready for discharge Status is: Observation>>Inpatient (d/w UM RN)  The patient will require care spanning > 2 midnights and should be moved to inpatient because: Inpatient level of care appropriate due to severity of illness  Dispo: The patient is from: Home              Anticipated d/c is to: Home              Anticipated d/c date is: 2 days              Patient currently is not medically stable to d/c.  Barriers to Discharge: Multiple breakthrough seizures-history of nonepileptic seizures-long-term EEG in progress  Antimicrobial agents: Anti-infectives (From admission, onward)   None       Time spent: 25- minutes-Greater than 50% of this time was spent in counseling, explanation of diagnosis, planning of further management, and coordination of care.  MEDICATIONS: Scheduled Meds: . docusate sodium  100 mg Oral BID  . levETIRAcetam  500 mg Oral BID  . levothyroxine  62.5 mcg Oral Q0600  . sodium chloride flush  3 mL Intravenous Q12H   Continuous Infusions: PRN Meds:.acetaminophen **OR** acetaminophen, ondansetron **OR** ondansetron (ZOFRAN) IV   PHYSICAL EXAM: Vital signs: Vitals:   10/24/19 0000 10/24/19 0045 10/24/19 0245 10/24/19 0400  BP:  (!) 104/57 (!) 97/53 (!) 100/43  Pulse:   67   Resp: (!) 23 16 19 14   Temp:  98 F (36.7 C) 98.1 F (36.7 C)   TempSrc:  Oral Oral  SpO2: 99% 95% 98%   Weight:      Height:       Filed Weights   10/23/19 1429  Weight: 83 kg   Body mass index is 31.41 kg/m.   Gen Exam:Alert awake-not in any distress HEENT:atraumatic, normocephalic Chest: B/L clear to auscultation anteriorly CVS:S1S2 regular Abdomen:soft non tender, non distended Extremities:no edema Neurology: Non focal Skin: no rash  I have personally reviewed following labs and imaging studies  LABORATORY DATA: CBC: Recent Labs  Lab 10/18/19 1211 10/23/19 1320 10/24/19 0428  WBC 3.9* 4.6 4.3  NEUTROABS  --  1.9 1.6*  HGB  14.9 14.4 14.4  HCT 45.4 44.4 44.5  MCV 90.3 90.8 89.4  PLT 181 172 836    Basic Metabolic Panel: Recent Labs  Lab 10/18/19 1211 10/23/19 1320 10/24/19 0428  NA 138 139 135  K 4.3 4.8 4.0  CL 103 102 103  CO2 28 29 26   GLUCOSE 131* 93 112*  BUN 17 19 15   CREATININE 0.80 0.90 0.87  CALCIUM 9.6 9.2 9.1  MG  --  1.8 1.6*  PHOS  --   --  4.8*    GFR: Estimated Creatinine Clearance: 132.7 mL/min (by C-G formula based on SCr of 0.87 mg/dL).  Liver Function Tests: Recent Labs  Lab 10/18/19 1211 10/23/19 1320 10/24/19 0428  AST 26 29 25   ALT 18 21 21   ALKPHOS 73 64 59  BILITOT 0.6 0.6 0.6  PROT 7.7 7.4 6.6  ALBUMIN 3.9 3.8 3.3*   No results for input(s): LIPASE, AMYLASE in the last 168 hours. Recent Labs  Lab 10/23/19 1447 10/24/19 0428  AMMONIA 61* 56*    Coagulation Profile: No results for input(s): INR, PROTIME in the last 168 hours.  Cardiac Enzymes: No results for input(s): CKTOTAL, CKMB, CKMBINDEX, TROPONINI in the last 168 hours.  BNP (last 3 results) No results for input(s): PROBNP in the last 8760 hours.  Lipid Profile: No results for input(s): CHOL, HDL, LDLCALC, TRIG, CHOLHDL, LDLDIRECT in the last 72 hours.  Thyroid Function Tests: Recent Labs    10/24/19 0428  TSH 4.314    Anemia Panel: No results for input(s): VITAMINB12, FOLATE, FERRITIN, TIBC, IRON, RETICCTPCT in the last 72 hours.  Urine analysis:    Component Value Date/Time   COLORURINE YELLOW 10/24/2019 [place] 10/24/2019 1105   LABSPEC 1.027 10/24/2019 1105   PHURINE 6.0 10/24/2019 1105   GLUCOSEU NEGATIVE 10/24/2019 1105   HGBUR NEGATIVE 10/24/2019 1105   HGBUR trace-intact 09/28/2008 1527   BILIRUBINUR NEGATIVE 10/24/2019 1105   KETONESUR NEGATIVE 10/24/2019 1105   PROTEINUR NEGATIVE 10/24/2019 1105   UROBILINOGEN 0.2 09/28/2008 1527   NITRITE NEGATIVE 10/24/2019 1105   LEUKOCYTESUR NEGATIVE 10/24/2019 1105    Sepsis Labs: Lactic Acid, Venous No  results found for: LATICACIDVEN  MICROBIOLOGY: Recent Results (from the past 240 hour(s))  SARS Coronavirus 2 by RT PCR (hospital order, performed in [place] hospital lab) Nasopharyngeal Nasopharyngeal Swab     Status: None   Collection Time: 10/23/19  3:00 PM   Specimen: Nasopharyngeal Swab  Result Value Ref Range Status   SARS Coronavirus 2 NEGATIVE NEGATIVE Final    Comment: (NOTE) SARS-CoV-2 target nucleic acids are NOT DETECTED. The SARS-CoV-2 RNA is generally detectable in upper and lower respiratory specimens during the acute phase of infection. The lowest concentration of SARS-CoV-2 viral copies this assay can detect is 250 copies / mL. A negative result does not preclude SARS-CoV-2 infection and should  not be used as the sole basis for treatment or other patient management decisions.  A negative result may occur with improper specimen collection / handling, submission of specimen other than nasopharyngeal swab, presence of viral mutation(s) within the areas targeted by this assay, and inadequate number of viral copies (<250 copies / mL). A negative result must be combined with clinical observations, patient history, and epidemiological information. Fact Sheet for Patients:   BoilerBrush.com.cy Fact Sheet for Healthcare Providers: https://pope.com/ This test is not yet approved or cleared  by the [place] FDA and has been authorized for detection and/or diagnosis of SARS-CoV-2 by FDA under an Emergency Use Authorization (EUA).  This EUA will remain in effect (meaning this test can be used) for the duration of the COVID-19 declaration under Section 564(b)(1) of the Act, 21 U.S.C. section 360bbb-3(b)(1), unless the authorization is terminated or revoked sooner. Performed at [facility], [address], [place], [place] [address]     RADIOLOGY STUDIES/RESULTS: CT Head Wo Contrast  Result Date:  10/23/2019 CLINICAL DATA:  Witnessed seizure EXAM: CT HEAD WITHOUT CONTRAST TECHNIQUE: Contiguous axial images were obtained from the base of the skull through the vertex without intravenous contrast. COMPARISON:  04/23/2018 FINDINGS: Brain: Chronic encephalomalacia within the left frontal and temporal regions with cortical calcifications, stable. No sign of acute infarct or hemorrhage. Lateral ventricles and midline structures are stable. No acute extra-axial fluid collections. No mass effect. Vascular: No hyperdense vessel or unexpected calcification. Skull: Postsurgical changes from prior left temporal craniotomy. No acute or destructive bony lesion. Sinuses/Orbits: No acute finding. Other: None. IMPRESSION: 1. 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 2. No acute intracranial process. Electronically Signed   By: Sharlet Salina M.D.   On: 10/23/2019 16:01   DG CHEST PORT 1 VIEW  Result Date: 10/23/2019 CLINICAL DATA:  Seizure.  Acute encephalopathy. EXAM: PORTABLE CHEST 1 VIEW COMPARISON:  None. FINDINGS: Low lung volumes. Upper normal heart size likely accentuated by low lung volumes. There is bronchial vascular crowding. Minimal streaky retrocardiac atelectasis. No confluent airspace disease. No pulmonary edema, pleural effusion or pneumothorax. No acute osseous abnormalities are seen. IMPRESSION: 1. Low lung volumes with bronchial vascular crowding. Streaky retrocardiac atelectasis. 2. Upper normal heart size likely accentuated by low lung volumes. Electronically Signed   By: Narda Rutherford M.D.   On: 10/23/2019 19:43     LOS: 0 days   Jeoffrey Massed, MD  [facility]    To contact the attending provider between 7A-7P or the covering provider during after hours 7P-7A, please log into the web site [email or web address] and access using universal [place] password for that web site. If you do not have the password, please call the hospital  operator.  10/24/2019, 11:50 AM

## 2019-10-24 NOTE — Progress Notes (Addendum)
In the pt's room performing q4 neuro assessment. Pt alert & oriented x4. Pt then began to have seizure-like activity with eyelid fluttering and mild jerking of both arms, turning to the left side. Activity lasted for 4 mins. Spoke with Dr. Melynda Ripple and advised to hold ativan. Pt somewhat drowsy after incident, but after a few mins was able to answer all orientation questions. Pt current O2 sats 97% on RA.

## 2019-10-24 NOTE — Progress Notes (Signed)
LTM EEG hooked up and running - no initial skin breakdown - push button tested - neuro notified.  

## 2019-10-24 NOTE — Progress Notes (Addendum)
Subjective: No further clinical seizure-like episodes.  Patient states he is in the hospital because he had a seizure but does not remember the description of the seizure.  Denies any other concerns.  ROS: negative except above  Examination  Vital signs in last 24 hours: Temp:  [98 F (36.7 C)-98.8 F (37.1 C)] 98.1 F (36.7 C) (05/28 0245) Pulse Rate:  [59-85] 67 (05/28 0245) Resp:  [13-23] 14 (05/28 0400) BP: (89-137)/(36-92) 100/43 (05/28 0400) SpO2:  [95 %-100 %] 98 % (05/28 0245) Weight:  [83 kg] 83 kg (05/27 1429)  General: lying in bed, not in apparent distress CVS: pulse-normal rate and rhythm RS: breathing comfortably, CTAB Extremities: normal, warm  Neuro: MS: Alert, oriented to person and place, follows commands CN: pupils equal and reactive,  EOMI, face symmetric, tongue midline, normal sensation over face, Motor: 5/5 strength in all 4 extremities  Basic Metabolic Panel: Recent Labs  Lab 10/18/19 1211 10/23/19 1320 10/24/19 0428  NA 138 139 135  K 4.3 4.8 4.0  CL 103 102 103  CO2 28 29 26   GLUCOSE 131* 93 112*  BUN 17 19 15   CREATININE 0.80 0.90 0.87  CALCIUM 9.6 9.2 9.1  MG  --  1.8 1.6*  PHOS  --   --  4.8*    CBC: Recent Labs  Lab 10/18/19 1211 10/23/19 1320 10/24/19 0428  WBC 3.9* 4.6 4.3  NEUTROABS  --  1.9 1.6*  HGB 14.9 14.4 14.4  HCT 45.4 44.4 44.5  MCV 90.3 90.8 89.4  PLT 181 172 169     Coagulation Studies: No results for input(s): LABPROT, INR in the last 72 hours.  Imaging CT head without contrast 10/23/2019: Stable chronic encephalomalacia within the left frontal and temporal regions.  No acute intracranial process.       ASSESSMENT AND PLAN: Mr. 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  On arrival valproic acid level was supratherapeutic at 108 and ammonia was elevated at 61.  Patient was started on Keppra 500 mg twice daily and transferred to [facility] for long-term EEG monitoring.   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  Unclear etiology for breakthrough seizures.  Patient does have medically refractory epilepsy so this might be his baseline seizure frequency.  Will talk to family to gather more details   Recommendations -Valproic acid level this morning 75 however ammonia continues to be elevated at 56.  Therefore we will continue to hold valproic acid dose today.  Of note, per review of records from 09/25/2019 by Dr. [facility] at [facility], his valproic acid dose was increased from 1250 mg twice daily to 1500 mg twice daily.   - Clinically patient does not seem to be altered therefore we can defer using lactulose for hyperammonemia at this point. However if it persists, can add lactulose. Will check level again tomorrow -Patient was started on Keppra 500 mg twice daily yesterday.  We will continue that for now. -We will obtain long-term EEG to assess if current breakthrough seizures were epileptic or nonepileptic to help guide treatment. -Continue seizure precautions  next-as needed IV Ativan for generalized tonic-clonic seizure lasting with intermittent focal seizure lasting more than 5 minutes -Attempted to call legal guardian Ms. Timoteo Gaul but per voicemail she is out of office currently.  Also called Ms. Wayna Chalet, went to voicemail.  Voicemail left with callback number.   ADDENDUM -received call back from Ms. Wayna Chalet who spoke with the security guard at the shelter where patient is currently staying. According to the security guard patient had multiple episodes where either his head would shake or his arms will jerk or his legs will jerk for a few minutes. However, in between episodes he would open his eyes and smile and be able to communicate. After the end of the episodes he said he did not know where he was. -Description of these episodes sounds concerning for nonepileptic event. However patient does have history of epilepsy. Therefore will obtain LTM EEG to be certain that patient is not having any further breakthrough seizures today. -Ms. Ishmael Holter also mentioned that patient has follow-up with the [facility] neurologist next week.  I have spent a total of  35 minutes with the patient reviewing hospital notes,  test results, labs and examining the patient as well as establishing an assessment and plan that was discussed personally with the patient and team as described above.  > 50% of time was spent in direct patient care.     Zeb Comfort Epilepsy [facility] For questions after 5pm please refer to AMION to reach the Neurologist on call

## 2019-10-24 NOTE — Progress Notes (Signed)
Tele called to report pt desatting into the 70s. Upon assessment O2 sensor was falling off and waveform was poor. New sensor applied and pt satting at 97% on RA.

## 2019-10-25 DIAGNOSIS — G40909 Epilepsy, unspecified, not intractable, without status epilepticus: Secondary | ICD-10-CM

## 2019-10-25 LAB — MAGNESIUM: Magnesium: 1.8 mg/dL (ref 1.7–2.4)

## 2019-10-25 LAB — URINE CULTURE: Culture: 10000 — AB

## 2019-10-25 LAB — AMMONIA: Ammonia: 71 umol/L — ABNORMAL HIGH (ref 9–35)

## 2019-10-25 MED ORDER — LACTULOSE 10 GM/15ML PO SOLN
10.0000 g | Freq: Two times a day (BID) | ORAL | Status: DC
  Administered 2019-10-25 – 2019-10-28 (×6): 10 g via ORAL
  Filled 2019-10-25 (×6): qty 30

## 2019-10-25 MED ORDER — RISPERIDONE 0.5 MG PO TABS
0.5000 mg | ORAL_TABLET | Freq: Two times a day (BID) | ORAL | Status: DC
  Administered 2019-10-25 – 2019-10-28 (×7): 0.5 mg via ORAL
  Filled 2019-10-25 (×7): qty 1

## 2019-10-25 NOTE — Procedures (Addendum)
Patient Name: David [name]  MRN: [id number]  Epilepsy Attending: Charlsie Quest  Referring Physician/Provider: Dr. Georgiana Spinner Aroor Duration: 10/24/2019 1136 to 10/25/2019  1136  Patient history: 19 year old male with history of medically refractory epilepsy presented with breakthrough seizures.  EEG evaluate for seizures.  Level of alertness: Awake, asleeep  AEDs during EEG study: Keppra  Technical aspects: This EEG study was done with scalp electrodes positioned according to the 10-20 International system of electrode placement. Electrical activity was acquired at a sampling rate of 500Hz  and reviewed with a high frequency filter of 70Hz  and a low frequency filter of 1Hz . EEG data were recorded continuously and digitally stored.   Description:  The posterior dominant rhythm consists of 9-10 Hz activity of moderate voltage (25-35 uV) seen predominantly in posterior head regions, symmetric and reactive to eye opening and eye closing. Drowsiness was characterized by attenuation of the posterior background rhythm. Sleep was characterized by vertex waves, sleep spindles (12 to 14 Hz), maximal frontocentral region.  Hyperventilation and photic stimulation were not performed.     Event button was pressed on 10/24/2019 at 1344, 1352 and 2305. During the events, patient was noted to have non rhythmic whole body jerking, not answering questions lasting upto 4 minutes. Concomitant eeg before, during and after the event didn't show any eeeg change to suggest seizure.   Event button was pressed on 10/25/2019 at 1041 and 1051. During the events, patient was noted to have eye fluttering and staring. Concomitant eeg before, during and after the event didn't show any eeeg change to suggest seizure.   IMPRESSION: This study is within normal limits. No seizures or epileptiform discharges were seen throughout the recording.  Event button was pressed on 10/24/2019 and 10/25/2019 as described above with no  concomitant EEG change. These events were NON EPILEPTIC.   Kumar Falwell 10/27/2019

## 2019-10-25 NOTE — Progress Notes (Addendum)
PROGRESS NOTE        PATIENT DETAILS Name: David [name] Age: 19 y.o. Sex: male Date of Birth: [birth year] Admit Date: 10/23/2019 Admitting Physician Dewayne Shorter Levora Dredge, MD ACZ:[id number], No Pcp Per  Brief Narrative: Patient is a 19 y.o. male with history of seizures, autism, hypothyroidism, anti-NMDA receptor encephalitis in 2009-presented to the hospital with breakthrough seizures.  Significant events: 5/27>> presented to [facility] with breakthrough seizures-transfer to [facility]  Significant studies: 5/27: CT head without contrast>> no acute intracranial process, stable chronic encephalomalacia within the left frontal and temporal regions.  Antimicrobial therapy: None  Microbiology data: None  Procedures : 5/28-5/29: LTM EEG>> No seizures or epileptiform discharges were seen throughout the recording.Event button was pressed on 10/24/2019 as described above with no concomitant EEG change. These events were NON EPILEPTIC.  Consults: Neurology  DVT Prophylaxis : Prophylactic Lovenox   Subjective: Sleeping when I walked in this morning-he woke up with a gentle sternal rub-did not want to talk to me-and current the other way and went back to sleep.  Per nursing staff-refusing blood work and Barrister's clerk.  Assessment/Plan: Seizure disorder with numerous breakthrough seizures: Appears that so-called breakthrough seizures were nonepileptic-LTM EEG negative so far.  Continue Keppra-await further recommendations from neurology.  Hyperammonemia: Likely secondary to Depakote-Depakote on hold-await labs this morning.  Hypothyroidism: Continue Synthroid.  TSH within normal limits  History of nonepileptic spells: Appears that the seizures he has had this admission are nonepileptic.  See above.  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  Homelessness: Social Solicitor pending  Obesity: Estimated body mass index is 31.41 kg/m as calculated from the following:   Height as of this encounter: 5\' 4"  (1.626 m).   Weight as of this encounter: 83 kg.    Diet: Diet Order            Diet regular Room service appropriate? Yes; Fluid consistency: Thin  Diet effective now               Code Status: Full code  Family Communication: None/Spouse at bedside  Disposition Plan: Home vs Home with Home health vs SNF when ready for discharge Status is: Observation>>Inpatient (d/w UM RN)  The patient will require care spanning > 2 midnights and should be moved to inpatient because: Inpatient level of care appropriate due to severity of illness  Dispo: The patient is from: Home              Anticipated d/c is to: Home              Anticipated d/c date is: 2 days              Patient currently is not medically stable to d/c.  Barriers to Discharge: Multiple breakthrough seizures-history of nonepileptic seizures-long-term EEG in progress  Antimicrobial agents: Anti-infectives (From admission, onward)   None       Time spent: 25- minutes-Greater than 50% of this time was spent in counseling, explanation of diagnosis, planning of further management, and coordination of care.  MEDICATIONS: Scheduled Meds: . docusate sodium  100 mg Oral BID  . enoxaparin (LOVENOX) injection  40 mg Subcutaneous Q24H  . levETIRAcetam  500 mg Oral BID  . levothyroxine  62.5 mcg Oral Q0600  .  sodium chloride flush  3 mL Intravenous Q12H   Continuous Infusions: PRN Meds:.acetaminophen **OR** acetaminophen, LORazepam, ondansetron **OR** ondansetron (ZOFRAN) IV   PHYSICAL EXAM: Vital signs: Vitals:   10/24/19 1943 10/24/19 2317 10/25/19 0332 10/25/19 0830  BP: 108/66 (!) 110/57 102/74 99/61  Pulse: 76 86 65   Resp: 20 19 17    Temp: 98.4 F (36.9 C) 98.3 F (36.8 C) 97.9 F  (36.6 C) 97.7 F (36.5 C)  TempSrc: Oral Axillary Axillary Axillary  SpO2: 97% 97% 96% 96%  Weight:      Height:       Filed Weights   10/23/19 1429  Weight: 83 kg   Body mass index is 31.41 kg/m.   Gen Exam: Not in any distress HEENT:atraumatic, normocephalic Chest: B/L clear to auscultation anteriorly CVS:S1S2 regular Abdomen:soft non tender, non distended Extremities:no edema Neurology: Difficult exam but moving all 4 extremities. Skin: no rash  I have personally reviewed following labs and imaging studies  LABORATORY DATA: CBC: Recent Labs  Lab 10/18/19 1211 10/23/19 1320 10/24/19 0428  WBC 3.9* 4.6 4.3  NEUTROABS  --  1.9 1.6*  HGB 14.9 14.4 14.4  HCT 45.4 44.4 44.5  MCV 90.3 90.8 89.4  PLT 181 172 169    Basic Metabolic Panel: Recent Labs  Lab 10/18/19 1211 10/23/19 1320 10/24/19 0428  NA 138 139 135  K 4.3 4.8 4.0  CL 103 102 103  CO2 28 29 26   GLUCOSE 131* 93 112*  BUN 17 19 15   CREATININE 0.80 0.90 0.87  CALCIUM 9.6 9.2 9.1  MG  --  1.8 1.6*  PHOS  --   --  4.8*    GFR: Estimated Creatinine Clearance: 132.7 mL/min (by C-G formula based on SCr of 0.87 mg/dL).  Liver Function Tests: Recent Labs  Lab 10/18/19 1211 10/23/19 1320 10/24/19 0428  AST 26 29 25   ALT 18 21 21   ALKPHOS 73 64 59  BILITOT 0.6 0.6 0.6  PROT 7.7 7.4 6.6  ALBUMIN 3.9 3.8 3.3*   No results for input(s): LIPASE, AMYLASE in the last 168 hours. Recent Labs  Lab 10/23/19 1447 10/24/19 0428  AMMONIA 61* 56*    Coagulation Profile: No results for input(s): INR, PROTIME in the last 168 hours.  Cardiac Enzymes: No results for input(s): CKTOTAL, CKMB, CKMBINDEX, TROPONINI in the last 168 hours.  BNP (last 3 results) No results for input(s): PROBNP in the last 8760 hours.  Lipid Profile: No results for input(s): CHOL, HDL, LDLCALC, TRIG, CHOLHDL, LDLDIRECT in the last 72 hours.  Thyroid Function Tests: Recent Labs    10/24/19 0428  TSH 4.314     Anemia Panel: No results for input(s): VITAMINB12, FOLATE, FERRITIN, TIBC, IRON, RETICCTPCT in the last 72 hours.  Urine analysis:    Component Value Date/Time   COLORURINE YELLOW 10/24/2019 1105   APPEARANCEUR CLEAR 10/24/2019 1105   LABSPEC 1.027 10/24/2019 1105   PHURINE 6.0 10/24/2019 1105   GLUCOSEU NEGATIVE 10/24/2019 1105   HGBUR NEGATIVE 10/24/2019 1105   HGBUR trace-intact 09/28/2008 1527   BILIRUBINUR NEGATIVE 10/24/2019 1105   KETONESUR NEGATIVE 10/24/2019 1105   PROTEINUR NEGATIVE 10/24/2019 1105   UROBILINOGEN 0.2 09/28/2008 1527   NITRITE NEGATIVE 10/24/2019 1105   LEUKOCYTESUR NEGATIVE 10/24/2019 1105    Sepsis Labs: Lactic Acid, Venous No results found for: LATICACIDVEN  MICROBIOLOGY: Recent Results (from the past 240 hour(s))  SARS Coronavirus 2 by RT PCR (hospital order, performed in [facility] hospital lab) Nasopharyngeal Nasopharyngeal Swab  Status: None   Collection Time: 10/23/19  3:00 PM   Specimen: Nasopharyngeal Swab  Result Value Ref Range Status   SARS Coronavirus 2 NEGATIVE NEGATIVE Final    Comment: (NOTE) SARS-CoV-2 target nucleic acids are NOT DETECTED. The SARS-CoV-2 RNA is generally detectable in upper and lower respiratory specimens during the acute phase of infection. The lowest concentration of SARS-CoV-2 viral copies this assay can detect is 250 copies / mL. A negative result does not preclude SARS-CoV-2 infection and should not be used as the sole basis for treatment or other patient management decisions.  A negative result may occur with improper specimen collection / handling, submission of specimen other than nasopharyngeal swab, presence of viral mutation(s) within the areas targeted by this assay, and inadequate number of viral copies (<250 copies / mL). A negative result must be combined with clinical observations, patient history, and epidemiological information. Fact Sheet for Patients:    BoilerBrush.com.cy Fact Sheet for Healthcare Providers: https://pope.com/ This test is not yet approved or cleared  by the [place] FDA and has been authorized for detection and/or diagnosis of SARS-CoV-2 by FDA under an Emergency Use Authorization (EUA).  This EUA will remain in effect (meaning this test can be used) for the duration of the COVID-19 declaration under Section 564(b)(1) of the Act, 21 U.S.C. section 360bbb-3(b)(1), unless the authorization is terminated or revoked sooner. Performed at [facility], [address], [place], [place] [address]   Urine culture     Status: None (Preliminary result)   Collection Time: 10/24/19 10:41 AM   Specimen: Urine, Random  Result Value Ref Range Status   Specimen Description URINE, RANDOM  Final   Special Requests   Final    NONE Performed at [facility], [address] [place], [place] [address]    Culture   Final    CULTURE REINCUBATED FOR BETTER GROWTH Performed at [facility], [address] [place], [place] [address]    Report Status PENDING  Incomplete    RADIOLOGY STUDIES/RESULTS: EEG  Result Date: 10/24/2019 Charlsie Quest, MD     10/24/2019 12:21 PM Patient Name: David [name] MRN: [id number] Epilepsy Attending: Charlsie Quest Referring Physician/Provider: Dr. Raj Janus Date: 10/24/2019 Duration: 28.59 minutes Patient history: 19 year old male with history of medically refractory epilepsy presented with breakthrough seizures.  EEG evaluate for seizures. Level of alertness: Awake AEDs during EEG study: Keppra Technical aspects: This EEG study was done with scalp electrodes positioned according to the 10-20 International system of electrode placement. Electrical activity was acquired at a sampling rate of 500Hz  and reviewed with a high frequency filter of 70Hz  and a low frequency filter of 1Hz . EEG data were recorded  continuously and digitally stored. Description: The posterior dominant rhythm consists of 9 Hz activity of moderate voltage (25-35 uV) seen predominantly in posterior head regions, symmetric and reactive to eye opening and eye closing. Hyperventilation did not show any EEG change.  Physiology photic driving was not seen during photic stimulation.  IMPRESSION: This study is within normal limits. No seizures or epileptiform discharges were seen throughout the recording.   CT Head Wo Contrast  Result Date: 10/23/2019 CLINICAL DATA:  Witnessed seizure EXAM: CT HEAD WITHOUT CONTRAST TECHNIQUE: Contiguous axial images were obtained from the base of the skull through the vertex without intravenous contrast. COMPARISON:  04/23/2018 FINDINGS: Brain: Chronic encephalomalacia within the left frontal and temporal regions with cortical calcifications, stable. No sign  of acute infarct or hemorrhage. Lateral ventricles and midline structures are stable. No acute extra-axial fluid collections. No mass effect. Vascular: No hyperdense vessel or unexpected calcification. Skull: Postsurgical changes from prior left temporal craniotomy. No acute or destructive bony lesion. Sinuses/Orbits: No acute finding. Other: None. IMPRESSION: 1. 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 2. No acute intracranial process. Electronically Signed   By: Randa Ngo M.D.   On: 10/23/2019 16:01   DG CHEST PORT 1 VIEW  Result Date: 10/23/2019 CLINICAL DATA:  Seizure.  Acute encephalopathy. EXAM: PORTABLE CHEST 1 VIEW COMPARISON:  None. FINDINGS: Low lung volumes. Upper normal heart size likely accentuated by low lung volumes. There is bronchial vascular crowding. Minimal streaky retrocardiac atelectasis. No confluent airspace disease. No pulmonary edema, pleural effusion or pneumothorax. No acute osseous abnormalities are seen. IMPRESSION: 1. Low lung volumes with bronchial vascular crowding.  Streaky retrocardiac atelectasis. 2. Upper normal heart size likely accentuated by low lung volumes. Electronically Signed   By: Keith Rake M.D.   On: 10/23/2019 19:43   Overnight EEG with video  Result Date: 10/25/2019 Lora Havens, MD     10/25/2019  9:19 AM Patient Name: David [name] MRN: [id number] Epilepsy Attending: Lora Havens Referring Physician/Provider: Dr. Karena Addison Aroor Duration: 10/24/2019 1136 to 10/25/2019 0900  Patient history: 19 year old male with history of medically refractory epilepsy presented with breakthrough seizures.  EEG evaluate for seizures.  Level of alertness: Awake, asleeep  AEDs during EEG study: Keppra  Technical aspects: This EEG study was done with scalp electrodes positioned according to the 10-20 International system of electrode placement. Electrical activity was acquired at a sampling rate of 500Hz  and reviewed with a high frequency filter of 70Hz  and a low frequency filter of 1Hz . EEG data were recorded continuously and digitally stored.  Description:  The posterior dominant rhythm consists of 9-10 Hz activity of moderate voltage (25-35 uV) seen predominantly in posterior head regions, symmetric and reactive to eye opening and eye closing. Drowsiness was characterized by attenuation of the posterior background rhythm. Sleep was characterized by vertex waves, sleep spindles (12 to 14 Hz), maximal frontocentral region.  Hyperventilation and photic stimulation were not performed.   Event button was pressed on 10/24/2019 at 1344, 1352 and 2305. During the events, patient was noted to have non rhythmic whole body jerking, not answering questions lasting upto 4 minutes. Concomitant eeg before, during and after the event didn't show any eeeg change to suggest seizure. IMPRESSION: This study is within normal limits. No seizures or epileptiform discharges were seen throughout the recording. Event button was pressed on 10/24/2019 as described above with no  concomitant EEG change. These events were NON EPILEPTIC. [place]     LOS: 1 day   Oren Binet, MD  [facility]    To contact the attending provider between 7A-7P or the covering provider during after hours 7P-7A, please log into the web site [email or web address] and access using universal [place] password for that web site. If you do not have the password, please call the hospital operator.  10/25/2019, 10:49 AM

## 2019-10-25 NOTE — Care Plan (Signed)
Another event captured. Patient noted to have eye flittering, not responding. NO concomitant eeg change. This was a NON EPILEPTIC EVENT.   David [name] David [name]

## 2019-10-25 NOTE — Progress Notes (Signed)
Pt refusing am labs this morning.  Fighting phlebotomy staff and pulling arm away.  Notified Dr. Windell Norfolk, MD.

## 2019-10-25 NOTE — Progress Notes (Signed)
An additional event occurred.  Paged Dr. Melynda Ripple MD, event was captured on EEG.  Per consultation with Dr. Melynda Ripple, MD event was non-epileptic.  Continue to monitor.

## 2019-10-25 NOTE — Progress Notes (Signed)
LTM EEG discontinued - no skin breakdown at unhook.   

## 2019-10-25 NOTE — Progress Notes (Signed)
Reason for consult:   Subjective: Patient had multiple events yesterday characterized as nonrhythmic whole body jerking and not answer questions up to 4 minutes.  These were nonepileptic events.  Patient today awake and alert-however, not cooperative on examination.  Does not answer any questions and after repeated requests will follow commands.  Very particular about having the TV on.   ROS: negative except above   Examination  Vital signs in last 24 hours: Temp:  [97.7 F (36.5 C)-98.4 F (36.9 C)] 97.7 F (36.5 C) (05/29 0830) Pulse Rate:  [65-86] 65 (05/29 0332) Resp:  [17-25] 17 (05/29 0332) BP: (99-132)/(54-74) 99/61 (05/29 0830) SpO2:  [79 %-98 %] 96 % (05/29 0830)  General: lying in bed Psych: Noncooperative CVS: pulse-normal rate and rhythm RS: breathing comfortably Extremities: normal   Neuro: MS: Alert, follows commands,  CN: pupils equal and reactive,  EOMI, face symmetric, tongue midline, normal sensation over face, Motor: 5/5 strength in all 4 extremities Reflexes: 2+ bilaterally over patella, biceps, plantars: flexor Coordination: normal Gait: not tested  Basic Metabolic Panel: Recent Labs  Lab 10/18/19 1211 10/23/19 1320 10/24/19 0428  NA 138 139 135  K 4.3 4.8 4.0  CL 103 102 103  CO2 28 29 26   GLUCOSE 131* 93 112*  BUN 17 19 15   CREATININE 0.80 0.90 0.87  CALCIUM 9.6 9.2 9.1  MG  --  1.8 1.6*  PHOS  --   --  4.8*    CBC: Recent Labs  Lab 10/18/19 1211 10/23/19 1320 10/24/19 0428  WBC 3.9* 4.6 4.3  NEUTROABS  --  1.9 1.6*  HGB 14.9 14.4 14.4  HCT 45.4 44.4 44.5  MCV 90.3 90.8 89.4  PLT 181 172 169     Coagulation Studies: No results for input(s): LABPROT, INR in the last 72 hours.    IMPRESSION: This study is within normal limits. No seizures or epileptiform discharges were seen throughout the recording.  Event button was pressed on 10/24/2019 as described above with no concomitant EEG change. These events were NON  EPILEPTIC.    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  Long-term EEG placed and patient has had multiples events captured-all events captured have been nonepileptic.  He does have true seizures and therefore needs to be on antiepileptic drug, VPA initially held as his dose was supratherapeutic and ammonia level was elevated at 56. Today ammonia continues to remain elevated 71.  Impression Nonepileptic spells History of focal temporal lobe epilepsy and history of anti- NMDA encephalitis Hyperammonemia Depakote toxicity  Recommendations Discontinue long-term EEG-multiple events captured were nonepileptic Continue Keppra 500 mg twice daily Continue to treat hyperammonemia, start lactulose Continue to hold Depakote until ammonia normalizes  Neurology will continue to follow.  [facility] Daun Rens [facility] Pager Number [phone] For questions after 7pm please refer to AMION to reach the Neurologist on call

## 2019-10-25 NOTE — Progress Notes (Signed)
Pt told NT he was feeling dizzy. Upon assessment pt confirms dizziness and then turned to his right side and began to mildly jerk both arms and flutter eyelids. Activity lasted from 1959 to 2002. Dr. Amada Jupiter notified, no ativan given. Pt drowsy after incident, but alert & oriented x4 and able to operate TV remote. O2 sats at 96% on RA.

## 2019-10-26 DIAGNOSIS — F840 Autistic disorder: Secondary | ICD-10-CM

## 2019-10-26 DIAGNOSIS — G9340 Encephalopathy, unspecified: Secondary | ICD-10-CM

## 2019-10-26 LAB — VALPROIC ACID LEVEL: Valproic Acid Lvl: 17 ug/mL — ABNORMAL LOW (ref 50.0–100.0)

## 2019-10-26 LAB — AMMONIA: Ammonia: 52 umol/L — ABNORMAL HIGH (ref 9–35)

## 2019-10-26 MED ORDER — SODIUM CHLORIDE 0.9 % IV BOLUS
500.0000 mL | Freq: Once | INTRAVENOUS | Status: AC
  Administered 2019-10-26: 500 mL via INTRAVENOUS

## 2019-10-26 MED ORDER — DIVALPROEX SODIUM ER 500 MG PO TB24
1250.0000 mg | ORAL_TABLET | Freq: Two times a day (BID) | ORAL | Status: DC
  Administered 2019-10-26 – 2019-10-28 (×5): 1250 mg via ORAL
  Filled 2019-10-26 (×6): qty 1

## 2019-10-26 NOTE — Procedures (Signed)
Patient David [name] EYC:[id number] Epilepsy Attending:Martisha Toulouse [name] Referring Physician/Provider:Dr. Georgiana Spinner Aroor Duration:10/25/2019 1136 to 10/25/2019  1423  Patient history:19 year old male with history of medically refractory epilepsy presented with breakthrough seizures. EEG evaluate for seizures.  Level of alertness:Awake, asleeep  AEDs during EEG study:Keppra  Technical aspects: This EEG study was done with scalp electrodes positioned according to the 10-20 International system of electrode placement. Electrical activity was acquired at a sampling rate of 500Hz  and reviewed with a high frequency filter of 70Hz  and a low frequency filter of 1Hz . EEG data were recorded continuously and digitally stored.   Description:  The posterior dominant rhythm consists of 9-10 Hz activity of moderate voltage (25-35 uV) seen predominantly in posterior head regions, symmetric and reactive to eye opening and eye closing. Drowsiness was characterized by attenuation of the posterior background rhythm. Sleep was characterized by vertex waves, sleep spindles (12 to 14 Hz), maximal frontocentral region.  Hyperventilation and photic stimulation were not performed.     Event button was pressed on 10/25/2019 at 1348. During the event, patient was noted to have non rhythmic whole body jerking for few seconds. After RN asked him tyo stop, he stopped and smiled. Concomitant eeg before, during and after the event didn't show any eeeg change to suggest seizure.   IMPRESSION: This study is within normal limits. No seizures or epileptiform discharges were seen throughout the recording.  Event button was pressed on 10/25/2019 as described above with no concomitant EEG change. This events was NON EPILEPTIC.   David [name] 

## 2019-10-26 NOTE — Consult Note (Signed)
Telepsych Consultation   Reason for Consult:  ''multiple pseudoseizures, hx of intellectual disability/Autism/Limbic encephalitis refusing labs/at times agitated on Risperidone at home.'' Referring Physician:  Oren Binet, MD Location of Patient: [facility] Location of Provider: [facility]  Patient Identification: David [name] MRN:  [id number] Principal Diagnosis: Seizure [facility]) Diagnosis:  Principal Problem:   Seizure ([place]) Active Problems:   Hypothyroidism   Intellectual disability   Seizure disorder ([place])   Recurrent seizures ([place])   Autism disorder   Total Time spent with patient: 45 minutes  Subjective:   David [name] is a 19 y.o. male patient admitted for evaluation of seizures.  HPI: 19 year old male with history of Intellectual disability, Autism,  seizures, and pseudoseizures who was brought to the hospital after having a shaking episode at the shelter.  Patient is a poor historian but able to reports that he lives at the shelter and does not have contact with his parents. Per chart review, patient currently awaiting placement in group home. Today, patient is alert, calm, cooperative, denies psychosis, delusions, self harming thoughts but he is a poor historian. Patient unable to provide information about his shelter, parents or case worker. He has difficulty processing information due to Autism/intellectual disability.  Past Psychiatric History: as above  Risk to Self:   denies Risk to Others:  denies Prior Inpatient Therapy:  none reported Prior Outpatient Therapy:    Past Medical History:  Past Medical History:  Diagnosis Date  . Autism   . Intellectual disability    Moderate  . Limbic encephalitis   . Seizures ([place])   . Speech delay   . Thyroid disease     Past Surgical History:  Procedure Laterality Date  . BRAIN SURGERY     Brain biopsy  . GASTROSTOMY TUBE REVISION     Family History: History reviewed. No pertinent family  history. Family Psychiatric  History:  Social History:  Social History   Substance and Sexual Activity  Alcohol Use No     Social History   Substance and Sexual Activity  Drug Use No    Social History   Socioeconomic History  . Marital status: Single    Spouse name: Not on file  . Number of children: Not on file  . Years of education: Not on file  . Highest education level: Not on file  Occupational History  . Occupation: Unemployed  Tobacco Use  . Smoking status: Passive Smoke Exposure - Never Smoker  . Smokeless tobacco: Never Used  Substance and Sexual Activity  . Alcohol use: No  . Drug use: No  . Sexual activity: Never  Other Topics Concern  . Not on file  Social History Narrative   Pt lives in [place] in a shelter.  Unemployed.  Currently not followed by an outpatient psychiatrist.  Pt has a Education officer, museum and guardian -- Timoteo Gaul [phone]   Social Determinants of Health   Financial Resource Strain:   . Difficulty of Paying Living Expenses:   Food Insecurity:   . Worried About Charity fundraiser in the Last Year:   . Arboriculturist in the Last Year:   Transportation Needs:   . Film/video editor (Medical):   Marland Kitchen Lack of Transportation (Non-Medical):   Physical Activity:   . Days of Exercise per Week:   . Minutes of Exercise per Session:   Stress:   . Feeling of Stress :   Social Connections:   . Frequency of Communication with  Friends and Family:   . Frequency of Social Gatherings with Friends and Family:   . Attends Religious Services:   . Active Member of Clubs or Organizations:   . Attends Banker Meetings:   Marland Kitchen Marital Status:    Additional Social History:    Allergies:   Allergies  Allergen Reactions  . Penicillins Hives    DID THE REACTION INVOLVE: Swelling of the face/tongue/throat, SOB, or low BP? Yes Sudden or severe rash/hives, skin peeling, or the inside of the mouth or nose? No Did it require medical  treatment? No When did it last happen? Was an infant If all above answers are "NO", may proceed with cephalosporin use.    Labs:  Results for orders placed or performed during the hospital encounter of 10/23/19 (from the past 48 hour(s))  Magnesium     Status: None   Collection Time: 10/25/19  3:10 PM  Result Value Ref Range   Magnesium 1.8 1.7 - 2.4 mg/dL    Comment: Performed at [facility], [address] [place], [place] [address]  Ammonia     Status: Abnormal   Collection Time: 10/25/19  3:10 PM  Result Value Ref Range   Ammonia 71 (H) 9 - 35 umol/L    Comment: Performed at [facility], [address] [place], [place] [address]  Valproic acid level     Status: Abnormal   Collection Time: 10/26/19  4:57 AM  Result Value Ref Range   Valproic Acid Lvl 17 (L) 50.0 - 100.0 ug/mL    Comment: Performed at [facility], [address] [place], [place] [address]  Ammonia     Status: Abnormal   Collection Time: 10/26/19  4:57 AM  Result Value Ref Range   Ammonia 52 (H) 9 - 35 umol/L    Comment: Performed at [facility], [address] [place], [place] [address]    Medications:  Current Facility-Administered Medications  Medication Dose Route Frequency Provider Last Rate Last Admin  . acetaminophen (TYLENOL) tablet 650 mg  650 mg Oral Q6H PRN Therisa Doyne, MD   650 mg at 10/25/19 1104   Or  . acetaminophen (TYLENOL) suppository 650 mg  650 mg Rectal Q6H PRN Doutova, Anastassia, MD      . divalproex (DEPAKOTE ER) 24 hr tablet 1,250 mg  1,250 mg Oral BID Maretta Bees, MD   1,250 mg at 10/26/19 1245  . docusate sodium (COLACE) capsule 100 mg  100 mg Oral BID Therisa Doyne, MD   100 mg at 10/26/19 0942  . enoxaparin (LOVENOX) injection 40 mg  40 mg Subcutaneous Q24H Maretta Bees, MD   40 mg at 10/24/19 1305  . lactulose (CHRONULAC) 10 GM/15ML solution 10 g  10 g Oral BID Aroor, Dara Lords, MD   10 g at 10/26/19 0942  . levETIRAcetam  (KEPPRA) tablet 500 mg  500 mg Oral BID Therisa Doyne, MD   500 mg at 10/26/19 0941  . levothyroxine (SYNTHROID) tablet 62.5 mcg  62.5 mcg Oral Q0600 Therisa Doyne, MD   62.5 mcg at 10/26/19 0514  . LORazepam (ATIVAN) injection 2 mg  2 mg Intravenous PRN Charlsie Quest, MD      . ondansetron (ZOFRAN) tablet 4 mg  4 mg Oral Q6H PRN Doutova, Anastassia, MD       Or  . ondansetron (ZOFRAN) injection 4 mg  4 mg Intravenous Q6H PRN Therisa Doyne, MD      .  risperiDONE (RISPERDAL) tablet 0.5 mg  0.5 mg Oral BID Maretta Bees, MD   0.5 mg at 10/26/19 0941  . sodium chloride flush (NS) 0.9 % injection 3 mL  3 mL Intravenous Q12H Therisa Doyne, MD   3 mL at 10/26/19 0941    Musculoskeletal: Strength & Muscle Tone: not tested, patient assessed via telemed Gait & Station: not tested, patient assessed via telemed Patient leans: N/A  Psychiatric Specialty Exam: Physical Exam  Psychiatric: He has a normal mood and affect. His speech is normal. Judgment and thought content normal. He is slowed. He exhibits abnormal recent memory.    Review of Systems  Constitutional: Negative.   HENT: Negative.   Eyes: Negative.   Respiratory: Negative.   Cardiovascular: Negative.   Psychiatric/Behavioral: Negative.     Blood pressure 121/68, pulse 62, temperature 97.8 F (36.6 C), temperature source Oral, resp. rate 18, height 5\' 4"  (1.626 m), weight 83 kg, SpO2 99 %.Body mass index is 31.41 kg/m.  General Appearance: Casual  Eye Contact:  Good  Speech:  Clear and Coherent  Volume:  Normal  Mood:  Euthymic  Affect:  Appropriate  Thought Process:  Coherent  Orientation:  Other:  only to place, person and not to time  Thought Content:  concrete  Suicidal Thoughts:  No  Homicidal Thoughts:  No  Memory:  Immediate;   Fair Recent;   Poor Remote;   Fair  Judgement: marginal  Insight:  Shallow  Psychomotor Activity:  Normal  Concentration:  Concentration: Fair and Attention  Span: Fair  Recall:  of Knowledge:  limited due to intellectual disability  Language:  Good  Akathisia:  No  Handed:  Right  AIMS (if indicated):     Assets:  Communication Skills  ADL's:  Intact  Cognition:  fair  Sleep:        Treatment Plan Summary: 19 year old male with history of Autism, intellectual disability who was admitted for seizures. Patient reportedly agitated and refusing labs yesterday  but he is calm and cooperative today. Please note  that patient has difficulty processing information due to intellectual disability/Autism and as a result may not be able to make an informed medical decision or cooperate with labs work up. Based on current symptoms, patient no longer meet criteria for inpatient psychiatric admission.  Recommendations: -Continue Risperdal 0.5 mg twice daily for behavior/agitation-patient is currently calm/cooperative. -Consider social worker consult to activate legal guardianship.  Disposition: No evidence of imminent risk to self or others at present.   Patient does not meet criteria for psychiatric inpatient admission. Psychiatric service signing out. Re-consult as needed  This service was provided via telemedicine using a 2-way, interactive audio and video technology.  Names of all persons participating in this telemedicine service and their role in this encounter. Name: David [name] Role: Patient  Name: Van Clines Role: RN  Name: Tressia Miners Role: Psychiatrist    Leticia Penna, MD 10/26/2019 2:31 PM

## 2019-10-26 NOTE — Progress Notes (Signed)
   10/26/19 1707  Assess: MEWS Score  Temp 98.6 F (37 C)  BP 98/65  Pulse Rate 77  ECG Heart Rate 82  Resp (!) 23  SpO2 100 %  O2 Device Room Air  Assess: MEWS Score  MEWS Temp 0  MEWS Systolic 1  MEWS Pulse 0  MEWS RR 1  MEWS LOC 0  MEWS Score 2  MEWS Score Color Yellow  Assess: if the MEWS score is Yellow or Red  Were vital signs taken at a resting state? No  Focused Assessment Documented focused assessment  Early Detection of Sepsis Score *See Row Information* Low  MEWS guidelines implemented *See Row Information* No, other (Comment) (manual count of RR was 17, see remeasured VS)  Treat  MEWS Interventions Other (Comment) (manual count of RR was 17, see remeasured VS)  Take Vital Signs  Increase Vital Sign Frequency   (manual count of RR was 17, see remeasured VS)  Escalate  MEWS: Escalate  (manual count of RR was 17, see remeasured VS)  Notify: Charge Nurse/RN  Name of Charge Nurse/RN Notified  (manual count of RR was 17, see remeasured VS)  These vital signs were not communicated to me by the nurse tech or nurse secretary. There was no notification made to the nurse of MEWS yellow.

## 2019-10-26 NOTE — Progress Notes (Signed)
PROGRESS NOTE        PATIENT DETAILS Name: David [name] Age: 19 y.o. Sex: male Date of Birth: [birth year] Admit Date: 10/23/2019 Admitting Physician Dewayne Shorter Levora Dredge, MD FUX:[id number], No Pcp Per  Brief Narrative: Patient is a 19 y.o. male with history of seizures, autism, hypothyroidism, anti-NMDA receptor encephalitis in 2009-presented to the hospital with breakthrough seizures.  Significant events: 5/27>> presented to [facility] with breakthrough seizures-transfer to [facility]  Significant studies: 5/27: CT head without contrast>> no acute intracranial process, stable chronic encephalomalacia within the left frontal and temporal regions.  Antimicrobial therapy: None  Microbiology data: None  Procedures : 5/28-5/29: LTM EEG>> No seizures or epileptiform discharges were seen throughout the recording.Event button was pressed on 10/24/2019 as described above with no concomitant EEG change. These events were NON EPILEPTIC.  Consults: Neurology  DVT Prophylaxis : Prophylactic Lovenox   Subjective: Much more cooperative compared to yesterday.  Does not speak much-we will not head-sometimes answers with one-word sentences.  Assessment/Plan: Seizure disorder with numerous nonepileptic seizures: Appears that so-called breakthrough seizures were nonepileptic-LTM EEG negative so far.  Continue Keppra-restart Depakote at lower dose.  Appreciate neurology follow-have consulted psychiatry.  Hyperammonemia: Likely secondary to Depakote-initially Depakote held-has been restarted.  Hypothyroidism: Continue Synthroid.  TSH within normal limits  History of nonepileptic spells: Numerous nonepileptic seizures this admission-have consulted psychiatry.  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   Weight as of this encounter: 83 kg.    Diet: Diet Order            Diet regular Room service appropriate? Yes; Fluid consistency: Thin  Diet effective now               Code Status: Full code  Family Communication: None at bedside  Disposition Plan: Status is: Inpatient   The patient will require care spanning > 2 midnights and should be moved to inpatient because: Inpatient level of care appropriate due to severity of illness  Dispo: The patient is from: Home              Anticipated d/c is to: Home              Anticipated d/c date is: 1 days              Patient currently is not medically stable to d/c.  Barriers to Discharge: Multiple breakthrough seizures-history of nonepileptic seizures-awaiting psychiatric evaluation-medication management/adjustment in process  Antimicrobial agents: Anti-infectives (From admission, onward)   None       Time spent: 25- minutes-Greater than 50% of this time was spent in counseling, explanation of diagnosis, planning of further management, and coordination of care.  MEDICATIONS: Scheduled Meds: . divalproex  1,250 mg Oral BID  . docusate sodium  100 mg Oral BID  . enoxaparin (LOVENOX) injection  40 mg Subcutaneous Q24H  . lactulose  10 g Oral BID  . levETIRAcetam  500 mg Oral BID  . levothyroxine  62.5 mcg Oral Q0600  . risperiDONE  0.5 mg Oral BID  . sodium chloride flush  3  mL Intravenous Q12H   Continuous Infusions: PRN Meds:.acetaminophen **OR** acetaminophen, LORazepam, ondansetron **OR** ondansetron (ZOFRAN) IV   PHYSICAL EXAM: Vital signs: Vitals:   10/26/19 0333 10/26/19 0451 10/26/19 0527 10/26/19 0731  BP: (!) 86/56 106/67 (!) 107/47 (!) 103/54  Pulse: 61   62  Resp: 18   18  Temp: 97.8 F  (36.6 C)   97.8 F (36.6 C)  TempSrc: Oral   Oral  SpO2: 99%   99%  Weight:      Height:       Filed Weights   10/23/19 1429  Weight: 83 kg   Body mass index is 31.41 kg/m.   Gen Exam: Not in any distress HEENT:atraumatic, normocephalic Chest: B/L clear to auscultation anteriorly CVS:S1S2 regular Abdomen:soft non tender, non distended Extremities:no edema Neurology: Difficult exam but moving all 4 extremities. Skin: no rash  I have personally reviewed following labs and imaging studies  LABORATORY DATA: CBC: Recent Labs  Lab 10/23/19 1320 10/24/19 0428  WBC 4.6 4.3  NEUTROABS 1.9 1.6*  HGB 14.4 14.4  HCT 44.4 44.5  MCV 90.8 89.4  PLT 172 527    Basic Metabolic Panel: Recent Labs  Lab 10/23/19 1320 10/24/19 0428 10/25/19 1510  NA 139 135  --   K 4.8 4.0  --   CL 102 103  --   CO2 29 26  --   GLUCOSE 93 112*  --   BUN 19 15  --   CREATININE 0.90 0.87  --   CALCIUM 9.2 9.1  --   MG 1.8 1.6* 1.8  PHOS  --  4.8*  --     GFR: Estimated Creatinine Clearance: 132.7 mL/min (by C-G formula based on SCr of 0.87 mg/dL).  Liver Function Tests: Recent Labs  Lab 10/23/19 1320 10/24/19 0428  AST 29 25  ALT 21 21  ALKPHOS 64 59  BILITOT 0.6 0.6  PROT 7.4 6.6  ALBUMIN 3.8 3.3*   No results for input(s): LIPASE, AMYLASE in the last 168 hours. Recent Labs  Lab 10/23/19 1447 10/24/19 0428 10/25/19 1510 10/26/19 0457  AMMONIA 61* 56* 71* 52*    Coagulation Profile: No results for input(s): INR, PROTIME in the last 168 hours.  Cardiac Enzymes: No results for input(s): CKTOTAL, CKMB, CKMBINDEX, TROPONINI in the last 168 hours.  BNP (last 3 results) No results for input(s): PROBNP in the last 8760 hours.  Lipid Profile: No results for input(s): CHOL, HDL, LDLCALC, TRIG, CHOLHDL, LDLDIRECT in the last 72 hours.  Thyroid Function Tests: Recent Labs    10/24/19 0428  TSH 4.314    Anemia Panel: No results for input(s): VITAMINB12, FOLATE,  FERRITIN, TIBC, IRON, RETICCTPCT in the last 72 hours.  Urine analysis:    Component Value Date/Time   COLORURINE YELLOW 10/24/2019 [place] 10/24/2019 1105   LABSPEC 1.027 10/24/2019 1105   PHURINE 6.0 10/24/2019 1105   GLUCOSEU NEGATIVE 10/24/2019 1105   HGBUR NEGATIVE 10/24/2019 1105   HGBUR trace-intact 09/28/2008 1527   BILIRUBINUR NEGATIVE 10/24/2019 1105   KETONESUR NEGATIVE 10/24/2019 1105   PROTEINUR NEGATIVE 10/24/2019 1105   UROBILINOGEN 0.2 09/28/2008 1527   NITRITE NEGATIVE 10/24/2019 1105   LEUKOCYTESUR NEGATIVE 10/24/2019 1105    Sepsis Labs: Lactic Acid, Venous No results found for: LATICACIDVEN  MICROBIOLOGY: Recent Results (from the past 240 hour(s))  SARS Coronavirus 2 by RT PCR (hospital order, performed in [place] hospital lab) Nasopharyngeal Nasopharyngeal Swab     Status: None  Collection Time: 10/23/19  3:00 PM   Specimen: Nasopharyngeal Swab  Result Value Ref Range Status   SARS Coronavirus 2 NEGATIVE NEGATIVE Final    Comment: (NOTE) SARS-CoV-2 target nucleic acids are NOT DETECTED. The SARS-CoV-2 RNA is generally detectable in upper and lower respiratory specimens during the acute phase of infection. The lowest concentration of SARS-CoV-2 viral copies this assay can detect is 250 copies / mL. A negative result does not preclude SARS-CoV-2 infection and should not be used as the sole basis for treatment or other patient management decisions.  A negative result may occur with improper specimen collection / handling, submission of specimen other than nasopharyngeal swab, presence of viral mutation(s) within the areas targeted by this assay, and inadequate number of viral copies (<250 copies / mL). A negative result must be combined with clinical observations, patient history, and epidemiological information. Fact Sheet for Patients:   BoilerBrush.com.cy Fact Sheet for Healthcare  Providers: https://pope.com/ This test is not yet approved or cleared  by the [place] FDA and has been authorized for detection and/or diagnosis of SARS-CoV-2 by FDA under an Emergency Use Authorization (EUA).  This EUA will remain in effect (meaning this test can be used) for the duration of the COVID-19 declaration under Section 564(b)(1) of the Act, 21 U.S.C. section 360bbb-3(b)(1), unless the authorization is terminated or revoked sooner. Performed at [facility], [address], [place], [place] [address]   Urine culture     Status: Abnormal   Collection Time: 10/24/19 10:41 AM   Specimen: Urine, Random  Result Value Ref Range Status   Specimen Description URINE, RANDOM  Final   Special Requests   Final    NONE Performed at [facility], [address] [place], [place] [address]    Culture (A)  Final    10,000 COLONIES/mL GROUP B STREP(S.AGALACTIAE)ISOLATED TESTING AGAINST S. AGALACTIAE NOT ROUTINELY PERFORMED DUE TO PREDICTABILITY OF AMP/PEN/VAN SUSCEPTIBILITY. Performed at [facility], [address] [place], [place] [address]    Report Status 10/25/2019 FINAL  Final    RADIOLOGY STUDIES/RESULTS: Overnight EEG with video  Result Date: 10/25/2019 Charlsie Quest, MD     10/26/2019  8:44 AM Patient Name: David [name] MRN: [id number] Epilepsy Attending: Charlsie Quest Referring Physician/Provider: Dr. Georgiana Spinner Aroor Duration: 10/24/2019 1136 to 10/25/2019  1136  Patient history: 18 year old male with history of medically refractory epilepsy presented with breakthrough seizures.  EEG evaluate for seizures.  Level of alertness: Awake, asleeep  AEDs during EEG study: Keppra  Technical aspects: This EEG study was done with scalp electrodes positioned according to the 10-20 International system of electrode placement. Electrical activity was acquired at a sampling rate of 500Hz  and reviewed with a high  frequency filter of 70Hz  and a low frequency filter of 1Hz . EEG data were recorded continuously and digitally stored.  Description:  The posterior dominant rhythm consists of 9-10 Hz activity of moderate voltage (25-35 uV) seen predominantly in posterior head regions, symmetric and reactive to eye opening and eye closing. Drowsiness was characterized by attenuation of the posterior background rhythm. Sleep was characterized by vertex waves, sleep spindles (12 to 14 Hz), maximal frontocentral region.  Hyperventilation and photic stimulation were not performed.   Event button was pressed on 10/24/2019 at 1344, 1352 and 2305. During the events, patient was noted to have non rhythmic whole body jerking, not answering questions lasting upto 4 minutes. Concomitant eeg before, during and after the event didn't  show any eeeg change to suggest seizure. Event button was pressed on 10/25/2019 at 1041 and 1051. During the events, patient was noted to have eye fluttering and staring. Concomitant eeg before, during and after the event didn't show any eeeg change to suggest seizure. IMPRESSION: This study is within normal limits. No seizures or epileptiform discharges were seen throughout the recording. Event button was pressed on 10/24/2019 and 10/25/2019 as described above with no concomitant EEG change. These events were NON EPILEPTIC. Priyanka [name]     LOS: 2 days   Jeoffrey Massed, MD  [facility]    To contact the attending provider between 7A-7P or the covering provider during after hours 7P-7A, please log into the web site [email or web address] and access using universal [place] password for that web site. If you do not have the password, please call the hospital operator.  10/26/2019, 12:26 PM

## 2019-10-26 NOTE — Progress Notes (Signed)
   10/26/19 1716  Assess: MEWS Score  Temp 98.6 F (37 C)  BP (!) 106/92  Pulse Rate 74  ECG Heart Rate 74  Resp 17  Level of Consciousness Alert  SpO2 100 %  O2 Device Room Air  Assess: MEWS Score  MEWS Temp 0  MEWS Systolic 0  MEWS Pulse 0  MEWS RR 0  MEWS LOC 0  MEWS Score 0  MEWS Score Color Green  Assess: if the MEWS score is Yellow or Red  Were vital signs taken at a resting state? No  Focused Assessment Documented focused assessment  Early Detection of Sepsis Score *See Row Information* Low  MEWS guidelines implemented *See Row Information* No, other (Comment) (manual count of RR was 17, see remeasured VS)  Treat  MEWS Interventions Other (Comment) (manual count of RR was 17, see remeasured VS)  Take Vital Signs  Increase Vital Sign Frequency   (manual count of RR was 17, see remeasured VS)  Escalate  MEWS: Escalate  (manual count of RR was 17, see remeasured VS)  Notify: Charge Nurse/RN  Name of Charge Nurse/RN Notified  (manual count of RR was 17, see remeasured VS)   Upon seeing that MEWS were yellow at 1707 (no notification made to nurse by either nurse secretary or nurse tech. Re-measured vital signs were green MEWS including manual counting of respiratory rate.  Normal vital sign frequency indicated.

## 2019-10-26 NOTE — Progress Notes (Signed)
Reason for consult: Spells concerning for seizures/nonepileptic spells  Subjective: Patient is lying in bed.  He is watching TV.  Does not verbalize but engages examiner.  Not following commands   ROS: negative except above  Examination  Vital signs in last 24 hours: Temp:  [97.8 F (36.6 C)-98.7 F (37.1 C)] 97.8 F (36.6 C) (05/30 0731) Pulse Rate:  [61-83] 62 (05/30 0731) Resp:  [17-25] 18 (05/30 0731) BP: (86-126)/(47-90) 103/54 (05/30 0731) SpO2:  [94 %-99 %] 99 % (05/30 0731)  General: lying in bed Psych: Flat affect CVS: pulse-normal rate and rhythm RS: breathing comfortably Extremities: normal   Neuro: MS: Awake but not cooperative CN: pupils equal and reactive,  EOMI, face symmetric Motor: Moves all extremities Reflexes: plantars: flexor Coordination: normal Gait: not tested  Basic Metabolic Panel: Recent Labs  Lab 10/23/19 1320 10/24/19 0428 10/25/19 1510  NA 139 135  --   K 4.8 4.0  --   CL 102 103  --   CO2 29 26  --   GLUCOSE 93 112*  --   BUN 19 15  --   CREATININE 0.90 0.87  --   CALCIUM 9.2 9.1  --   MG 1.8 1.6* 1.8  PHOS  --  4.8*  --     CBC: Recent Labs  Lab 10/23/19 1320 10/24/19 0428  WBC 4.6 4.3  NEUTROABS 1.9 1.6*  HGB 14.4 14.4  HCT 44.4 44.5  MCV 90.8 89.4  PLT 172 169     Coagulation Studies: No results for input(s): LABPROT, INR in the last 72 hours.  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  Long-term EEG placed and patient has had multiples events captured-all events captured have been nonepileptic.  He does have true seizures and therefore needs to be on antiepileptic drug, VPA initially held as his dose was supratherapeutic and ammonia level was elevated at 56. Today ammonia continues to remain  elevated 71.  Ammonia is down to 50 today.  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

## 2019-10-27 LAB — AMMONIA: Ammonia: 60 umol/L — ABNORMAL HIGH (ref 9–35)

## 2019-10-27 NOTE — Progress Notes (Signed)
PROGRESS NOTE        PATIENT DETAILS Name: David [name] Age: 19 y.o. Sex: male Date of Birth: 2000/10/12 Admit Date: 10/23/2019 Admitting Physician Dewayne Shorter Levora Dredge, MD ZOX:[id number], No Pcp Per  Brief Narrative: Patient is a 19 y.o. male with history of seizures, autism, hypothyroidism, anti-NMDA receptor encephalitis in 2009-presented to the hospital with breakthrough seizures.  Significant events: 5/27>> presented to [facility] with breakthrough seizures-transfer to [facility]  Significant studies: 5/27: CT head without contrast>> no acute intracranial process, stable chronic encephalomalacia within the left frontal and temporal regions.  Antimicrobial therapy: None  Microbiology data: None  Procedures : 5/28-5/29: LTM EEG>> No seizures or epileptiform discharges were seen throughout the recording.Event button was pressed on 10/24/2019 as described above with no concomitant EEG change. These events were NON EPILEPTIC.  Consults: Neurology Psych  DVT Prophylaxis : Prophylactic Lovenox   Subjective: Playing video games on his phone this morning.  Is following simple commands.  Assessment/Plan: Seizure disorder with numerous nonepileptic seizures: Appears that so-called breakthrough seizures were nonepileptic-LTM EEG negative so far.  Continue Keppra and Depakote (restarted at a lower dose).  Neurology following.  Hyperammonemia: Likely secondary to Depakote-initially Depakote held-has been restarted.  Continues to have mild hyperammonemia-but mental status is much improved-appears to be asymptomatic from minimally elevated ammonia levels.  Hypothyroidism: Continue Synthroid.  TSH within normal limits  History of nonepileptic spells: Numerous nonepileptic seizures this admission-have consulted psychiatry.  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  Homelessness: Social Solicitor pending  Obesity: Estimated body mass index is 31.41 kg/m as calculated from the following:   Height as of this encounter: 5\' 4"  (1.626 m).   Weight as of this encounter: 83 kg.    Diet: Diet Order            Diet regular Room service appropriate? Yes; Fluid consistency: Thin  Diet effective now               Code Status: Full code  Family Communication: None at bedside  Disposition Plan: Status is: Inpatient   The patient will require care spanning > 2 midnights and should be moved to inpatient because: Inpatient level of care appropriate due to severity of illness  Dispo: The patient is from: Home              Anticipated d/c is to: Home              Anticipated d/c date is: 1 days              Patient currently is medically stable for discharge  Barriers to Discharge: Medication arrangements [facility] pharmacy closed) before being discharged to shelter-discussed with social worker this morning.  Antimicrobial agents: Anti-infectives (From admission, onward)   None       Time spent: 25- minutes-Greater than 50% of this time was spent in counseling, explanation of diagnosis, planning of further management, and coordination of care.  MEDICATIONS: Scheduled Meds: . divalproex  1,250 mg Oral BID  . docusate sodium  100 mg Oral BID  . enoxaparin (LOVENOX) injection  40 mg Subcutaneous Q24H  . lactulose  10 g Oral BID  . levETIRAcetam  500 mg Oral BID  . levothyroxine  62.5 mcg Oral Q0600  .  risperiDONE  0.5 mg Oral BID  . sodium chloride flush  3 mL Intravenous Q12H   Continuous Infusions: PRN Meds:.acetaminophen **OR** acetaminophen, LORazepam, ondansetron **OR** ondansetron (ZOFRAN) IV   PHYSICAL EXAM: Vital signs: Vitals:   10/26/19 2321 10/27/19 0312 10/27/19 0800 10/27/19 1149  BP: (!) 112/53 99/66 120/61 120/84  Pulse: 86 83 92    Resp: 17 19    Temp: 98.7 F (37.1 C) 98.3 F (36.8 C) 98.1 F (36.7 C)   TempSrc: Oral Oral Oral   SpO2: 96% 97% 98% 98%  Weight:      Height:       Filed Weights   10/23/19 1429  Weight: 83 kg   Body mass index is 31.41 kg/m.   Gen Exam: Not in any distress HEENT:atraumatic, normocephalic Chest: B/L clear to auscultation anteriorly CVS:S1S2 regular Abdomen:soft non tender, non distended Extremities:no edema Neurology: Difficult exam but moving all 4 extremities. Skin: no rash  I have personally reviewed following labs and imaging studies  LABORATORY DATA: CBC: Recent Labs  Lab 10/23/19 1320 10/24/19 0428  WBC 4.6 4.3  NEUTROABS 1.9 1.6*  HGB 14.4 14.4  HCT 44.4 44.5  MCV 90.8 89.4  PLT 172 790    Basic Metabolic Panel: Recent Labs  Lab 10/23/19 1320 10/24/19 0428 10/25/19 1510  NA 139 135  --   K 4.8 4.0  --   CL 102 103  --   CO2 29 26  --   GLUCOSE 93 112*  --   BUN 19 15  --   CREATININE 0.90 0.87  --   CALCIUM 9.2 9.1  --   MG 1.8 1.6* 1.8  PHOS  --  4.8*  --     GFR: Estimated Creatinine Clearance: 132.7 mL/min (by C-G formula based on SCr of 0.87 mg/dL).  Liver Function Tests: Recent Labs  Lab 10/23/19 1320 10/24/19 0428  AST 29 25  ALT 21 21  ALKPHOS 64 59  BILITOT 0.6 0.6  PROT 7.4 6.6  ALBUMIN 3.8 3.3*   No results for input(s): LIPASE, AMYLASE in the last 168 hours. Recent Labs  Lab 10/23/19 1447 10/24/19 0428 10/25/19 1510 10/26/19 0457 10/27/19 0715  AMMONIA 61* 56* 71* 52* 60*    Coagulation Profile: No results for input(s): INR, PROTIME in the last 168 hours.  Cardiac Enzymes: No results for input(s): CKTOTAL, CKMB, CKMBINDEX, TROPONINI in the last 168 hours.  BNP (last 3 results) No results for input(s): PROBNP in the last 8760 hours.  Lipid Profile: No results for input(s): CHOL, HDL, LDLCALC, TRIG, CHOLHDL, LDLDIRECT in the last 72 hours.  Thyroid Function Tests: No results for input(s): TSH,  T4TOTAL, FREET4, T3FREE, THYROIDAB in the last 72 hours.  Anemia Panel: No results for input(s): VITAMINB12, FOLATE, FERRITIN, TIBC, IRON, RETICCTPCT in the last 72 hours.  Urine analysis:    Component Value Date/Time   COLORURINE YELLOW 10/24/2019 [name] 10/24/2019 1105   LABSPEC 1.027 10/24/2019 1105   PHURINE 6.0 10/24/2019 1105   GLUCOSEU NEGATIVE 10/24/2019 1105   HGBUR NEGATIVE 10/24/2019 1105   HGBUR trace-intact 09/28/2008 1527   BILIRUBINUR NEGATIVE 10/24/2019 1105   KETONESUR NEGATIVE 10/24/2019 1105   PROTEINUR NEGATIVE 10/24/2019 1105   UROBILINOGEN 0.2 09/28/2008 1527   NITRITE NEGATIVE 10/24/2019 1105   LEUKOCYTESUR NEGATIVE 10/24/2019 1105    Sepsis Labs: Lactic Acid, Venous No results found for: LATICACIDVEN  MICROBIOLOGY: Recent Results (from the past 240 hour(s))  SARS Coronavirus 2 by RT PCR (  hospital order, performed in [facility] hospital lab) Nasopharyngeal Nasopharyngeal Swab     Status: None   Collection Time: 10/23/19  3:00 PM   Specimen: Nasopharyngeal Swab  Result Value Ref Range Status   SARS Coronavirus 2 NEGATIVE NEGATIVE Final    Comment: (NOTE) SARS-CoV-2 target nucleic acids are NOT DETECTED. The SARS-CoV-2 RNA is generally detectable in upper and lower respiratory specimens during the acute phase of infection. The lowest concentration of SARS-CoV-2 viral copies this assay can detect is 250 copies / mL. A negative result does not preclude SARS-CoV-2 infection and should not be used as the sole basis for treatment or other patient management decisions.  A negative result may occur with improper specimen collection / handling, submission of specimen other than nasopharyngeal swab, presence of viral mutation(s) within the areas targeted by this assay, and inadequate number of viral copies (<250 copies / mL). A negative result must be combined with clinical observations, patient history, and epidemiological information.  Fact Sheet for Patients:   BoilerBrush.com.cy Fact Sheet for Healthcare Providers: https://pope.com/ This test is not yet approved or cleared  by the [place] FDA and has been authorized for detection and/or diagnosis of SARS-CoV-2 by FDA under an Emergency Use Authorization (EUA).  This EUA will remain in effect (meaning this test can be used) for the duration of the COVID-19 declaration under Section 564(b)(1) of the Act, 21 U.S.C. section 360bbb-3(b)(1), unless the authorization is terminated or revoked sooner. Performed at [facility], [address], [place], [place] [address]   Urine culture     Status: Abnormal   Collection Time: 10/24/19 10:41 AM   Specimen: Urine, Random  Result Value Ref Range Status   Specimen Description URINE, RANDOM  Final   Special Requests   Final    NONE Performed at [facility], [address] [place], [place] [address]    Culture (A)  Final    10,000 COLONIES/mL GROUP B STREP(S.AGALACTIAE)ISOLATED TESTING AGAINST S. AGALACTIAE NOT ROUTINELY PERFORMED DUE TO PREDICTABILITY OF AMP/PEN/VAN SUSCEPTIBILITY. Performed at [facility], [address], [place], [place] [address]    Report Status 10/25/2019 FINAL  Final    RADIOLOGY STUDIES/RESULTS: No results found.   LOS: 3 days   Jeoffrey Massed, MD  [facility]    To contact the attending provider between 7A-7P or the covering provider during after hours 7P-7A, please log into the web site [email or web address] and access using universal [place] password for that web site. If you do not have the password, please call the hospital operator.  10/27/2019, 11:50 AM

## 2019-10-27 NOTE — Progress Notes (Signed)
Pt states that he knows when his seizure is coming, with one hand over his left eyes "my eyes move when I'm about to have seizure" "I pass out." Pt pass out for a few second then wake up again. Than pt pass out again leaning his head against the rail for a few second. Called out his name and he awaken again. He is sitting comfortably in bed asking for his cake.

## 2019-10-27 NOTE — Plan of Care (Signed)
  Problem: Education: Goal: Knowledge of General Education information will improve Description: Including pain rating scale, medication(s)/side effects and non-pharmacologic comfort measures Outcome: Progressing   Problem: Nutrition: Goal: Adequate nutrition will be maintained Outcome: Progressing   Problem: Safety: Goal: Ability to remain free from injury will improve Outcome: Progressing   

## 2019-10-28 DIAGNOSIS — F79 Unspecified intellectual disabilities: Secondary | ICD-10-CM

## 2019-10-28 MED ORDER — VITAMIN D 25 MCG (1000 UNIT) PO TABS: 1000.0000 [IU] | ORAL_TABLET | Freq: Every day | ORAL | 0 refills | Status: DC

## 2019-10-28 MED ORDER — DIVALPROEX SODIUM ER 250 MG PO TB24: 1250.0000 mg | ORAL_TABLET | Freq: Two times a day (BID) | ORAL | 0 refills | Status: DC

## 2019-10-28 MED ORDER — LEVOTHYROXINE SODIUM 125 MCG PO TABS: 62.5000 ug | ORAL_TABLET | Freq: Every day | ORAL | 0 refills | Status: DC

## 2019-10-28 MED ORDER — LEVETIRACETAM 500 MG PO TABS: 500.0000 mg | ORAL_TABLET | Freq: Two times a day (BID) | ORAL | 0 refills | Status: DC

## 2019-10-28 MED ORDER — RISPERIDONE 0.5 MG PO TABS
0.5000 mg | ORAL_TABLET | Freq: Two times a day (BID) | ORAL | 0 refills | Status: DC
Start: 2019-10-28 — End: 2020-07-08

## 2019-10-28 MED ORDER — VITAMIN D 25 MCG (1000 UNIT) PO TABS: 1000.0000 [IU] | ORAL_TABLET | Freq: Every day | ORAL | 0 refills | Status: AC

## 2019-10-28 MED ORDER — RISPERIDONE 0.5 MG PO TABS
0.5000 mg | ORAL_TABLET | Freq: Two times a day (BID) | ORAL | 0 refills | Status: DC
Start: 2019-10-28 — End: 2019-10-28

## 2019-10-28 MED FILL — risperiDONE 0.5 MG TABS: 0.5 | 30 days supply | Qty: 60 | Fill #0

## 2019-10-28 MED FILL — DIVALPROEX SOD ER 250 MG TA: 250 | 14 days supply | Qty: 140 | Fill #0

## 2019-10-28 MED FILL — VITAMIN D3 25 MCG TABS: 25 | 30 days supply | Qty: 30 | Fill #0

## 2019-10-28 MED FILL — LEVOTHYROXINE SODIUM 125 MC: 125 | 30 days supply | Qty: 15 | Fill #0

## 2019-10-28 MED FILL — levETIRAcetam 500 MG TABS: 500 | 30 days supply | Qty: 60 | Fill #0

## 2019-10-28 NOTE — Progress Notes (Addendum)
Patient's IV removed, site is clean, dry and intact. Discharge instructions reviewed with patient, patient voiced understanding. Personal belongings packed and carried out with patient. Patient transported to main entrance via wheelchair with cab awaiting.

## 2019-10-28 NOTE — TOC Transition Note (Signed)
Transition of Care [facility]) - CM/SW Discharge Note   Patient Details  Name: David [name] MRN: [id number] Date of Birth: 08-20-2000  [facility] CM/SW Contact:  Baldemar Lenis, LCSW Phone Number: 10/28/2019, 12:57 PM   Clinical Narrative:   Patient discharging back to [facility] today, waiting on medications through [facility]. CSW provided cab voucher for transportation. CSW contacted legal guardian, Arnetha Courser, and left a voicemail that patient would be discharging today. No further needs at this time.    Final next level of care: Homeless Shelter Barriers to Discharge: Barriers Resolved   Patient Goals and CMS Choice        Discharge Placement                       Discharge Plan and Services   Discharge Planning Services: CM Consult                                 Social Determinants of Health (SDOH) Interventions     Readmission Risk Interventions No flowsheet data found.

## 2019-10-28 NOTE — Discharge Summary (Signed)
PATIENT DETAILS Name: David [name] Age: 19 y.o. Sex: male Date of Birth: 10-27-2000 MRN: [id number]. Admitting Physician: Maretta Bees, MD WUJ:[id number], No Pcp Per  Admit Date: 10/23/2019 Discharge date: 10/28/2019  Recommendations for Outpatient Follow-up:  1. Follow up with PCP in 1-2 weeks 2. Please obtain CMP/CBC in one week  Admitted From:  Homeless shelter  Disposition: Homeless shelter   Home Health: No  Equipment/Devices: None  Discharge Condition: Stable  CODE STATUS: FULL CODE  Diet recommendation:  Diet Order            Diet regular Room service appropriate? Yes; Fluid consistency: Thin  Diet effective now               Brief Narrative: Patient is a 19 y.o. male with history of seizures, autism, hypothyroidism, anti-NMDA receptor encephalitis in 2009-presented to the hospital with breakthrough seizures.  Significant events: 5/27>> presented to [facility] with breakthrough seizures-transfer to [facility]  Significant studies: 5/27: CT head without contrast>> no acute intracranial process, stable chronic encephalomalacia within the left frontal and temporal regions.  Antimicrobial therapy: None  Microbiology data: None  Procedures : 5/28-5/29: LTM EEG>> No seizures or epileptiform discharges were seen throughout the recording.Event button was pressed on 10/24/2019 as described above with no concomitant EEG change. These events were NON EPILEPTIC.  Consults: Neurology Psych  Brief Hospital Course: Seizure disorder with numerous nonepileptic seizures: Appears that so-called breakthrough seizures were nonepileptic-LTM EEG negative so far.  Continue Keppra and Depakote (restarted at a lower dose).  Neurology followed closely during this hospital course.  Hyperammonemia: Likely secondary to Depakote-initially Depakote held-has been restarted.  Continues to have mild hyperammonemia-but mental status is much improved-appears to be asymptomatic from  minimally elevated ammonia levels.  Hypothyroidism: Continue Synthroid.  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  Homelessness: Social worker/case management evaluation completed-they have been in touch with the legal guardian-plan is to discharge back to shelter.  Obesity: Estimated body mass index is 31.41 kg/m as calculated from the following:   Height as of this encounter: 5\' 4"  (1.626 m).   Weight as of this encounter: 83 kg   Discharge Diagnoses:  Principal Problem:   Seizure [facility]) Active Problems:   Hypothyroidism   Intellectual disability   Seizure disorder (HCC)   Recurrent seizures (HCC)   Autism disorder   Discharge Instructions:  Activity:  As tolerated    Discharge Instructions    Call MD for:  extreme fatigue   Complete by: As directed    Call MD for:  persistant dizziness or light-headedness   Complete by: As directed    Call MD for:  persistant nausea and vomiting   Complete by: As directed    Discharge instructions   Complete by: As directed     Per [facility] statutes, patients with seizures are not allowed to drive until they have been seizure-free for six months. Use caution when using heavy equipment or power tools. Avoid working on ladders or at heights. Take showers instead of baths. Ensure the water temperature is not too high on the home water heater. Do not go swimming alone. When caring for infants or small children, sit down when holding, feeding, or changing them to minimize risk of injury to the child in the event you have a  seizure.   Follow with Primary MD in 1-2 weeks  Please get a complete blood count and  chemistry panel checked by your Primary MD at your next visit, and again as instructed by your Primary MD.  Get Medicines reviewed and adjusted: Please take all your medications with you for your next visit with your Primary MD  Laboratory/radiological data: Please request your Primary MD to go over all hospital tests and procedure/radiological results at the follow up, please ask your Primary MD to get all Hospital records sent to his/her office.  In some cases, they will be blood work, cultures and biopsy results pending at the time of your discharge. Please request that your primary care M.D. follows up on these results.  Also Note the following: If you experience worsening of your admission symptoms, develop shortness of breath, life threatening emergency, suicidal or homicidal thoughts you must seek medical attention immediately by calling 911 or calling your MD immediately  if symptoms less severe.  You must read complete instructions/literature along with all the possible adverse reactions/side effects for all the Medicines you take and that have been prescribed to you. Take any new Medicines after you have completely understood and accpet all the possible adverse reactions/side effects.   Do not drive when taking Pain medications or sleeping medications (Benzodaizepines)  Do not take more than prescribed Pain, Sleep and Anxiety Medications. It is not advisable to combine anxiety,sleep and pain medications without talking with your primary care practitioner  Special Instructions: If you have smoked or chewed Tobacco  in the last 2 yrs please stop smoking, stop any regular Alcohol  and or any Recreational drug use.  Wear Seat belts while driving.  Please note: You were cared for by a hospitalist during your hospital stay. Once you are discharged, your primary care physician will handle any further medical issues. Please note that NO REFILLS for any discharge medications will be  authorized once you are discharged, as it is imperative that you return to your primary care physician (or establish a relationship with a primary care physician if you do not have one) for your post hospital discharge needs so that they can reassess your need for medications and monitor your lab values.   Increase activity slowly   Complete by: As directed      Allergies as of 10/28/2019      Reactions   Penicillins Hives   DID THE REACTION INVOLVE: Swelling of the face/tongue/throat, SOB, or low BP? Yes Sudden or severe rash/hives, skin peeling, or the inside of the mouth or nose? No Did it require medical treatment? No When did it last happen? Was an infant If all above answers are "NO", may proceed with cephalosporin use.      Medication List    STOP taking these medications   Eslicarbazepine Acetate 800 MG Tabs   levETIRAcetam 1000 MG/100ML Soln Commonly known as: KEPPRA     TAKE these medications   cholecalciferol 25 MCG (1000 UNIT) tablet Commonly known as: VITAMIN D3 Take 1 tablet (1,000 Units total) by mouth daily.   divalproex 250 MG 24 hr tablet Commonly known as: DEPAKOTE ER Take 5 tablets (1,250 mg total) by mouth 2 (two) times daily. What changed:   medication strength  how much to take   levETIRAcetam 500 MG tablet Commonly known as: KEPPRA Take 1 tablet (500 mg total) by mouth 2 (two) times daily.   levothyroxine 125 MCG tablet Commonly known as: SYNTHROID Take 0.5 tablets (62.5  mcg total) by mouth daily.   risperiDONE 0.5 MG tablet Commonly known as: RisperDAL Take 1 tablet (0.5 mg total) by mouth 2 (two) times daily.       Allergies  Allergen Reactions  . Penicillins Hives    DID THE REACTION INVOLVE: Swelling of the face/tongue/throat, SOB, or low BP? Yes Sudden or severe rash/hives, skin peeling, or the inside of the mouth or nose? No Did it require medical treatment? No When did it last happen? Was an infant If all above answers are "NO",  may proceed with cephalosporin use.     Other Procedures/Studies: EEG  Result Date: 10/24/2019 Charlsie QuestYadav, Priyanka O, MD     10/24/2019 12:21 PM Patient Name: David [name] MRN: [id number] Epilepsy Attending: Charlsie QuestPriyanka O Yadav Referring Physician/Provider: Dr. Raj JanusAnastasia Duotova Date: 10/24/2019 Duration: 28.59 minutes Patient history: 19 year old male with history of medically refractory epilepsy presented with breakthrough seizures.  EEG evaluate for seizures. Level of alertness: Awake AEDs during EEG study: Keppra Technical aspects: This EEG study was done with scalp electrodes positioned according to the 10-20 International system of electrode placement. Electrical activity was acquired at a sampling rate of 500Hz  and reviewed with a high frequency filter of 70Hz  and a low frequency filter of 1Hz . EEG data were recorded continuously and digitally stored. Description: The posterior dominant rhythm consists of 9 Hz activity of moderate voltage (25-35 uV) seen predominantly in posterior head regions, symmetric and reactive to eye opening and eye closing. Hyperventilation did not show any EEG change.  Physiology photic driving was not seen during photic stimulation.  IMPRESSION: This study is within normal limits. No seizures or epileptiform discharges were seen throughout the recording. Charlsie QuestPriyanka O Yadav   DG Shoulder Right  Result Date: 10/10/2019 CLINICAL DATA:  Pain. Seizure. EXAM: RIGHT SHOULDER - 2+ VIEW COMPARISON:  None. FINDINGS: There is no evidence of fracture or dislocation. Humeral head is normally located on the transscapular Y-view. Soft tissue edema is noted superior and laterally. Included ribs are intact. IMPRESSION: Soft tissue edema without acute osseous abnormality. Electronically Signed   By: Narda RutherfordMelanie  Sanford M.D.   On: 10/10/2019 20:55   CT Head Wo Contrast  Result Date: 10/23/2019 CLINICAL DATA:  Witnessed seizure EXAM: CT HEAD WITHOUT CONTRAST TECHNIQUE: Contiguous axial images were  obtained from the base of the skull through the vertex without intravenous contrast. COMPARISON:  04/23/2018 FINDINGS: Brain: Chronic encephalomalacia within the left frontal and temporal regions with cortical calcifications, stable. No sign of acute infarct or hemorrhage. Lateral ventricles and midline structures are stable. No acute extra-axial fluid collections. No mass effect. Vascular: No hyperdense vessel or unexpected calcification. Skull: Postsurgical changes from prior left temporal craniotomy. No acute or destructive bony lesion. Sinuses/Orbits: No acute finding. Other: None. IMPRESSION: 1. 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 2. No acute intracranial process. Electronically Signed   By: Sharlet SalinaMichael  Brown M.D.   On: 10/23/2019 16:01   DG CHEST PORT 1 VIEW  Result Date: 10/23/2019 CLINICAL DATA:  Seizure.  Acute encephalopathy. EXAM: PORTABLE CHEST 1 VIEW COMPARISON:  None. FINDINGS: Low lung volumes. Upper normal heart size likely accentuated by low lung volumes. There is bronchial vascular crowding. Minimal streaky retrocardiac atelectasis. No confluent airspace disease. No pulmonary edema, pleural effusion or pneumothorax. No acute osseous abnormalities are seen. IMPRESSION: 1. Low lung volumes with bronchial vascular crowding. Streaky retrocardiac atelectasis. 2. Upper normal heart size likely accentuated by low lung volumes. Electronically Signed   By: Shawna OrleansMelanie  Sanford M.D.   On: 10/23/2019 19:43   Overnight EEG with video  Result Date: 10/25/2019 Lora Havens, MD     10/26/2019  8:44 AM Patient Name: David [name] MRN: [id number] Epilepsy Attending: Lora Havens Referring Physician/Provider: Dr. Karena Addison Aroor Duration: 10/24/2019 1136 to 10/25/2019  1136  Patient history: 19 year old male with history of medically refractory epilepsy presented with breakthrough seizures.  EEG evaluate for seizures.  Level of alertness: Awake, asleeep  AEDs  during EEG study: Keppra  Technical aspects: This EEG study was done with scalp electrodes positioned according to the 10-20 International system of electrode placement. Electrical activity was acquired at a sampling rate of 500Hz  and reviewed with a high frequency filter of 70Hz  and a low frequency filter of 1Hz . EEG data were recorded continuously and digitally stored.  Description:  The posterior dominant rhythm consists of 9-10 Hz activity of moderate voltage (25-35 uV) seen predominantly in posterior head regions, symmetric and reactive to eye opening and eye closing. Drowsiness was characterized by attenuation of the posterior background rhythm. Sleep was characterized by vertex waves, sleep spindles (12 to 14 Hz), maximal frontocentral region.  Hyperventilation and photic stimulation were not performed.   Event button was pressed on 10/24/2019 at 1344, 1352 and 2305. During the events, patient was noted to have non rhythmic whole body jerking, not answering questions lasting upto 4 minutes. Concomitant eeg before, during and after the event didn't show any eeeg change to suggest seizure. Event button was pressed on 10/25/2019 at 1041 and 1051. During the events, patient was noted to have eye fluttering and staring. Concomitant eeg before, during and after the event didn't show any eeeg change to suggest seizure. IMPRESSION: This study is within normal limits. No seizures or epileptiform discharges were seen throughout the recording. Event button was pressed on 10/24/2019 and 10/25/2019 as described above with no concomitant EEG change. These events were NON EPILEPTIC. Priyanka Barbra Sarks     TODAY-DAY OF DISCHARGE:  Subjective:   David [name] today has no headache,no chest abdominal pain,no new weakness tingling or numbness, feels much better wants to go home today.  Objective:   Blood pressure 126/67, pulse 82, temperature 98.2 F (36.8 C), temperature source Oral, resp. rate 18, height 5\' 4"  (1.626  m), weight 83 kg, SpO2 100 %.  Intake/Output Summary (Last 24 hours) at 10/28/2019 1027 Last data filed at 10/28/2019 1016 Gross per 24 hour  Intake 533 ml  Output --  Net 533 ml   Filed Weights   10/23/19 1429  Weight: 83 kg    Exam: Awake Alert, Oriented *3, No new F.N deficits, Normal affect [place].AT,PERRAL Supple Neck,No JVD, No cervical lymphadenopathy appriciated.  Symmetrical Chest wall movement, Good air movement bilaterally, CTAB RRR,No Gallops,Rubs or new Murmurs, No Parasternal Heave +ve B.Sounds, Abd Soft, Non tender, No organomegaly appriciated, No rebound -guarding or rigidity. No Cyanosis, Clubbing or edema, No new Rash or bruise   PERTINENT RADIOLOGIC STUDIES: No results found.   PERTINENT LAB RESULTS: CBC: No results for input(s): WBC, HGB, HCT, PLT in the last 72 hours. CMET CMP     Component Value Date/Time   NA 135 10/24/2019 0428   NA 143 05/03/2018 1100   K 4.0 10/24/2019 0428   CL 103 10/24/2019 0428   CO2 26 10/24/2019 0428   GLUCOSE 112 (H) 10/24/2019 0428   BUN 15 10/24/2019 0428   BUN 13 05/03/2018 1100   CREATININE 0.87 10/24/2019 0428   CALCIUM 9.1  10/24/2019 0428   PROT 6.6 10/24/2019 0428   ALBUMIN 3.3 (L) 10/24/2019 0428   AST 25 10/24/2019 0428   ALT 21 10/24/2019 0428   ALKPHOS 59 10/24/2019 0428   BILITOT 0.6 10/24/2019 0428   GFRNONAA >60 10/24/2019 0428   GFRAA >60 10/24/2019 0428    GFR Estimated Creatinine Clearance: 132.7 mL/min (by C-G formula based on SCr of 0.87 mg/dL). No results for input(s): LIPASE, AMYLASE in the last 72 hours. No results for input(s): CKTOTAL, CKMB, CKMBINDEX, TROPONINI in the last 72 hours. Invalid input(s): POCBNP No results for input(s): DDIMER in the last 72 hours. No results for input(s): HGBA1C in the last 72 hours. No results for input(s): CHOL, HDL, LDLCALC, TRIG, CHOLHDL, LDLDIRECT in the last 72 hours. No results for input(s): TSH, T4TOTAL, T3FREE, THYROIDAB in the last 72  hours.  Invalid input(s): FREET3 No results for input(s): VITAMINB12, FOLATE, FERRITIN, TIBC, IRON, RETICCTPCT in the last 72 hours. Coags: No results for input(s): INR in the last 72 hours.  Invalid input(s): PT Microbiology: Recent Results (from the past 240 hour(s))  SARS Coronavirus 2 by RT PCR (hospital order, performed in [facility] hospital lab) Nasopharyngeal Nasopharyngeal Swab     Status: None   Collection Time: 10/23/19  3:00 PM   Specimen: Nasopharyngeal Swab  Result Value Ref Range Status   SARS Coronavirus 2 NEGATIVE NEGATIVE Final    Comment: (NOTE) SARS-CoV-2 target nucleic acids are NOT DETECTED. The SARS-CoV-2 RNA is generally detectable in upper and lower respiratory specimens during the acute phase of infection. The lowest concentration of SARS-CoV-2 viral copies this assay can detect is 250 copies / mL. A negative result does not preclude SARS-CoV-2 infection and should not be used as the sole basis for treatment or other patient management decisions.  A negative result may occur with improper specimen collection / handling, submission of specimen other than nasopharyngeal swab, presence of viral mutation(s) within the areas targeted by this assay, and inadequate number of viral copies (<250 copies / mL). A negative result must be combined with clinical observations, patient history, and epidemiological information. Fact Sheet for Patients:   BoilerBrush.com.cy Fact Sheet for Healthcare Providers: https://pope.com/ This test is not yet approved or cleared  by the [place] FDA and has been authorized for detection and/or diagnosis of SARS-CoV-2 by FDA under an Emergency Use Authorization (EUA).  This EUA will remain in effect (meaning this test can be used) for the duration of the COVID-19 declaration under Section 564(b)(1) of the Act, 21 U.S.C. section 360bbb-3(b)(1), unless the authorization is terminated  or revoked sooner. Performed at [facility], [address], [place], [place] [address]   Urine culture     Status: Abnormal   Collection Time: 10/24/19 10:41 AM   Specimen: Urine, Random  Result Value Ref Range Status   Specimen Description URINE, RANDOM  Final   Special Requests   Final    NONE Performed at [facility], [address], [place], [place] [address]    Culture (A)  Final    10,000 COLONIES/mL GROUP B STREP(S.AGALACTIAE)ISOLATED TESTING AGAINST S. AGALACTIAE NOT ROUTINELY PERFORMED DUE TO PREDICTABILITY OF AMP/PEN/VAN SUSCEPTIBILITY. Performed at [facility], [address], [place], [place] [address]    Report Status 10/25/2019 FINAL  Final    FURTHER DISCHARGE INSTRUCTIONS:  Get Medicines reviewed and adjusted: Please take all your medications with you for your next visit with your Primary MD  Laboratory/radiological data: Please  request your Primary MD to go over all hospital tests and procedure/radiological results at the follow up, please ask your Primary MD to get all Hospital records sent to his/her office.  In some cases, they will be blood work, cultures and biopsy results pending at the time of your discharge. Please request that your primary care M.D. goes through all the records of your hospital data and follows up on these results.  Also Note the following: If you experience worsening of your admission symptoms, develop shortness of breath, life threatening emergency, suicidal or homicidal thoughts you must seek medical attention immediately by calling 911 or calling your MD immediately  if symptoms less severe.  You must read complete instructions/literature along with all the possible adverse reactions/side effects for all the Medicines you take and that have been prescribed to you. Take any new Medicines after you have completely understood and accpet all the possible adverse reactions/side effects.   Do  not drive when taking Pain medications or sleeping medications (Benzodaizepines)  Do not take more than prescribed Pain, Sleep and Anxiety Medications. It is not advisable to combine anxiety,sleep and pain medications without talking with your primary care practitioner  Special Instructions: If you have smoked or chewed Tobacco  in the last 2 yrs please stop smoking, stop any regular Alcohol  and or any Recreational drug use.  Wear Seat belts while driving.  Please note: You were cared for by a hospitalist during your hospital stay. Once you are discharged, your primary care physician will handle any further medical issues. Please note that NO REFILLS for any discharge medications will be authorized once you are discharged, as it is imperative that you return to your primary care physician (or establish a relationship with a primary care physician if you do not have one) for your post hospital discharge needs so that they can reassess your need for medications and monitor your lab values.  Total Time spent coordinating discharge including counseling, education and face to face time equals 35 minutes.  SignedJeoffrey Massed 10/28/2019 10:27 AM

## 2019-11-05 ENCOUNTER — Other Ambulatory Visit: Payer: Self-pay

## 2019-11-05 ENCOUNTER — Encounter (HOSPITAL_COMMUNITY): Payer: Self-pay | Admitting: Emergency Medicine

## 2019-11-05 ENCOUNTER — Emergency Department (HOSPITAL_COMMUNITY): Payer: Medicaid Other

## 2019-11-05 ENCOUNTER — Emergency Department (HOSPITAL_COMMUNITY)
Admission: EM | Admit: 2019-11-05 | Discharge: 2019-11-05 | Disposition: A | Discharge status: Home / Self Care | Payer: Medicaid Other | Attending: Emergency Medicine | Admitting: Emergency Medicine

## 2019-11-05 DIAGNOSIS — R519 Headache, unspecified: Secondary | ICD-10-CM | POA: Diagnosis not present

## 2019-11-05 DIAGNOSIS — F840 Autistic disorder: Secondary | ICD-10-CM | POA: Insufficient documentation

## 2019-11-05 DIAGNOSIS — R569 Unspecified convulsions: Secondary | ICD-10-CM

## 2019-11-05 DIAGNOSIS — R42 Dizziness and giddiness: Secondary | ICD-10-CM | POA: Insufficient documentation

## 2019-11-05 DIAGNOSIS — Z79899 Other long term (current) drug therapy: Secondary | ICD-10-CM | POA: Insufficient documentation

## 2019-11-05 LAB — CBC WITH DIFFERENTIAL/PLATELET
Abs Immature Granulocytes: 0 10*3/uL (ref 0.00–0.07)
Basophils Absolute: 0 10*3/uL (ref 0.0–0.1)
Basophils Relative: 0 %
Eosinophils Absolute: 0.2 10*3/uL (ref 0.0–0.5)
Eosinophils Relative: 4 %
HCT: 43.7 % (ref 39.0–52.0)
Hemoglobin: 14.4 g/dL (ref 13.0–17.0)
Immature Granulocytes: 0 %
Lymphocytes Relative: 45 %
Lymphs Abs: 1.9 10*3/uL (ref 0.7–4.0)
MCH: 29.9 pg (ref 26.0–34.0)
MCHC: 33 g/dL (ref 30.0–36.0)
MCV: 90.7 fL (ref 80.0–100.0)
Monocytes Absolute: 0.4 10*3/uL (ref 0.1–1.0)
Monocytes Relative: 10 %
Neutro Abs: 1.7 10*3/uL (ref 1.7–7.7)
Neutrophils Relative %: 41 %
Platelets: 171 10*3/uL (ref 150–400)
RBC: 4.82 MIL/uL (ref 4.22–5.81)
RDW: 12.7 % (ref 11.5–15.5)
WBC: 4.1 10*3/uL (ref 4.0–10.5)
nRBC: 0 % (ref 0.0–0.2)

## 2019-11-05 LAB — COMPREHENSIVE METABOLIC PANEL
ALT: 19 U/L (ref 0–44)
AST: 27 U/L (ref 15–41)
Albumin: 4 g/dL (ref 3.5–5.0)
Alkaline Phosphatase: 62 U/L (ref 38–126)
Anion gap: 10 (ref 5–15)
BUN: 19 mg/dL (ref 6–20)
CO2: 27 mmol/L (ref 22–32)
Calcium: 9.4 mg/dL (ref 8.9–10.3)
Chloride: 104 mmol/L (ref 98–111)
Creatinine, Ser: 0.98 mg/dL (ref 0.61–1.24)
GFR calc Af Amer: >60 mL/min (ref >60)
GFR calc non Af Amer: >60 mL/min (ref >60)
Glucose, Bld: 107 mg/dL — ABNORMAL HIGH (ref 70–99)
Potassium: 4.8 mmol/L (ref 3.5–5.1)
Sodium: 141 mmol/L (ref 135–145)
Total Bilirubin: 0.4 mg/dL (ref 0.3–1.2)
Total Protein: 7.7 g/dL (ref 6.5–8.1)

## 2019-11-05 LAB — CBG MONITORING, ED: Glucose-Capillary: 97 mg/dL (ref 70–99)

## 2019-11-05 LAB — VALPROIC ACID LEVEL: Valproic Acid Lvl: 110 ug/mL — ABNORMAL HIGH (ref 50.0–100.0)

## 2019-11-05 NOTE — Progress Notes (Signed)
TOC CM received referral for transportation. Spoke to DSS Guardian, Arnetha Courser and consent was confirmed to arrange transportation with ED RN, Ilsa Iha RN. Arranged safe transport with [employer]. Formed emailed to transportation. Isidoro Donning RN CCM, [facility] ED TOC CM [phone]

## 2019-11-05 NOTE — Discharge Instructions (Signed)
Please follow up with your primary care provider within 5-7 days for re-evaluation of your symptoms. If you do not have a primary care provider, information for a healthcare clinic has been provided for you to make arrangements for follow up care. Please return to the emergency department for any new or worsening symptoms. ° °

## 2019-11-05 NOTE — ED Provider Notes (Signed)
[facility] DEPT Provider Note   CSN: [id number] Arrival date & time: 11/05/19  1216     History Chief Complaint  Patient presents with  . Seizures    David [name] is a 19 y.o. male.  HPI   19 year old male with a history of autism, intellectual disability, limbic encephalitis, speech delay, thyroid disease, seizures, pseudoseizures, who presents to the emergency department today for evaluation of a possible seizure.  Additional history obtained via EMS report.  Patient coming from a homeless shelter.  Staff stated that patient reported he was dizzy and then fell to the ground.  On EMS arrival patient was postictal.  He did not have any complaints.  He had a normal blood sugar and vital signs in route.  On my evaluation the patient is asking to be discharged because tomorrow is his birthday and he wants to celebrate.  He does not remember the episode that occurred prior to arrival.  He is complaining of a headache and thinks he may have hit his head but is not sure.  Denies any visual changes, nausea, vomiting, unilateral numbness/weakness.  He denies any chest pain, cough, shortness of breath, abdominal pain, nausea vomiting diarrhea or urinary complaints.  Past Medical History:  Diagnosis Date  . Autism   . Intellectual disability    Moderate  . Limbic encephalitis   . Seizures ([place])   . Speech delay   . Thyroid disease     Patient Active Problem List   Diagnosis Date Noted  . Autism disorder 10/26/2019  . Recurrent seizures ([place]) 10/23/2019  . 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 Seizure disorder ([place]) 04/24/2018  . Seizure ([place]) 04/24/2017  . Acne 11/03/2016  . Depression 11/03/2016  . Intellectual disability 11/03/2016  . 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 Epilepsy ([place]) 11/03/2016  . 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 Hypothyroidism 04/02/2008  . RHINITIS,  ALLERGIC 07/26/2006    Past Surgical History:  Procedure Laterality Date  . BRAIN SURGERY     Brain biopsy  . GASTROSTOMY TUBE REVISION         No family history on file.  Social History   Tobacco Use  . Smoking status: Passive Smoke Exposure - Never Smoker  . Smokeless tobacco: Never Used  Substance Use Topics  . Alcohol use: No  . Drug use: No    Home Medications Prior to Admission medications   Medication Sig Start Date End Date Taking? Authorizing Provider  cholecalciferol (VITAMIN D3) 25 MCG (1000 UNIT) tablet Take 1 tablet (1,000 Units total) by mouth daily. 10/28/19  Yes Ghimire, Henreitta Leber, MD  divalproex (DEPAKOTE ER) 250 MG 24 hr tablet Take 5 tablets (1,250 mg total) by mouth 2 (two) times daily. 10/28/19 11/27/19 Yes Ghimire, Henreitta Leber, MD  levETIRAcetam (KEPPRA) 500 MG tablet Take 1 tablet (500 mg total) by mouth 2 (two) times daily. 10/28/19 11/27/19 Yes Ghimire, Henreitta Leber, MD  levothyroxine (SYNTHROID) 125 MCG tablet Take 0.5 tablets (62.5 mcg total) by mouth daily. 10/28/19 11/27/19 Yes Ghimire, Henreitta Leber, MD  risperiDONE (RISPERDAL) 0.5 MG tablet Take 1 tablet (0.5 mg total) by mouth 2 (two) times daily. 10/28/19  Yes Ghimire, Henreitta Leber, MD    Allergies    Penicillins  Review of Systems   Review of Systems  Constitutional: Negative for fever.  HENT: Negative for ear pain and sore throat.   Eyes: Negative for visual disturbance.  Respiratory: Negative  for cough and shortness of breath.   Cardiovascular: Negative for chest pain.  Gastrointestinal: Negative for abdominal pain, constipation, diarrhea, nausea and vomiting.  Genitourinary: Negative for dysuria and hematuria.  Musculoskeletal: Negative for back pain and neck pain.  Skin: Negative for rash.  Neurological: Positive for dizziness and headaches. Negative for weakness, light-headedness and numbness.       Possible seizure, possible head trauma  All other systems reviewed and are negative.   Physical  Exam Updated Vital Signs BP (!) 112/44   Pulse 61   Temp 99 F (37.2 C) (Oral)   Resp 20   SpO2 100%   Physical Exam Vitals and nursing note reviewed.  Constitutional:      Appearance: He is well-developed.     Comments: Laughing on exam  HENT:     Head: Normocephalic and atraumatic.     Mouth/Throat:     Mouth: Mucous membranes are moist.  Eyes:     Conjunctiva/sclera: Conjunctivae normal.  Cardiovascular:     Rate and Rhythm: Normal rate and regular rhythm.     Pulses: Normal pulses.     Heart sounds: Normal heart sounds. No murmur.  Pulmonary:     Effort: Pulmonary effort is normal. No respiratory distress.     Breath sounds: Normal breath sounds. No wheezing, rhonchi or rales.  Abdominal:     General: Bowel sounds are normal.     Palpations: Abdomen is soft.     Tenderness: There is no abdominal tenderness. There is no guarding or rebound.  Musculoskeletal:     Cervical back: Neck supple.     Comments: No TTP to the CTL spine  Skin:    General: Skin is warm and dry.  Neurological:     Mental Status: He is alert.     Comments: Mental Status:  Alert, thought content appropriate, able to give a coherent history. Speech fluent without evidence of aphasia. Able to follow 2 step commands without difficulty.  Cranial Nerves:  II:  pupils equal, round, reactive to light III,IV, VI: ptosis not present, extra-ocular motions intact bilaterally  V,VII: smile symmetric, facial light touch sensation equal VIII: hearing grossly normal to voice  X: uvula elevates symmetrically  XI: bilateral shoulder shrug symmetric and strong XII: midline tongue extension without fassiculations Motor:  Normal tone. 5/5 strength of BUE and BLE major muscle groups including strong and equal grip strength and dorsiflexion/plantar flexion Sensory: light touch normal in all extremities.     ED Results / Procedures / Treatments   Labs (all labs ordered are listed, but only abnormal results are  displayed) Labs Reviewed  COMPREHENSIVE METABOLIC PANEL - Abnormal; Notable for the following components:      Result Value   Glucose, Bld 107 (*)    All other components within normal limits  VALPROIC ACID LEVEL - Abnormal; Notable for the following components:   Valproic Acid Lvl 110 (*)    All other components within normal limits  CBC WITH DIFFERENTIAL/PLATELET  CBG MONITORING, ED    EKG None  Radiology CT Head Wo Contrast  Result Date: 11/05/2019 CLINICAL DATA:  Headache with questionable seizure EXAM: CT HEAD WITHOUT CONTRAST TECHNIQUE: Contiguous axial images were obtained from the base of the skull through the vertex without intravenous contrast. COMPARISON:  Oct 23, 2019 and April 03, 2018 FINDINGS: Brain: There is ventricular prominence for age, particularly on the left, stable. There is again noted left hemispheric atrophy with encephalomalacia primarily in the left temporal lobe  region with involvement of the head of the caudate nucleus on the left. Multiple foci of calcification in these areas noted, stable. There is no intracranial mass, hemorrhage, extra-axial fluid collection, or midline shift. No acute appearing infarct is evident. Vascular: No hyperdense vessel.  No evident vascular calcification. Skull: There is evidence of previous craniotomy in the left temporal region. Bone in this area appears intact. No new bone lesion. Sinuses/Orbits: Visualized paranasal sinuses are clear. Visualized orbits appear symmetric bilaterally. Other: Mastoid air cells are clear. IMPRESSION: Left hemispheric atrophy with encephalomalacia, stable, with multiple areas of dystrophic appearing calcification. Mild dystrophic appearing calcification is noted in the head of the caudate nucleus on the left as well, stable. These changes are chronic and consistent with known encephalitis and history of brain biopsy in this area. No acute infarct. No mass or hemorrhage. Appearance stable compared to prior  studies. Electronically Signed   By: Bretta Bang III M.D.   On: 11/05/2019 14:51    Procedures Procedures (including critical care time)  Medications Ordered in ED Medications - No data to display  ED Course  I have reviewed the triage vital signs and the nursing notes.  Pertinent labs & imaging results that were available during my care of the patient were reviewed by me and considered in my medical decision making (see chart for details).    MDM Rules/Calculators/A&P                      19 year old male presenting for evaluation of possible seizure.  Reportedly at the homeless shelter prior to arrival patient complained of dizziness and then fell to the ground and was postictal upon EMS arrival.  On my assessment patient is at his baseline.  He is neurologically intact at this time.  We will get labs, valproic acid level, will get CT head due to concern for possible head trauma.  Initial CBG is normal CBC nonacute CMP nonacute Valproic acid level slightly elevated  CT head with left hemispheric atrophy with encephalomalacia, stable, with multiple areas of dystrophic appearing calcification. Mild dystrophic appearing calcification is noted in the head of the caudate nucleus on the left as well, stable. These changes are chronic and consistent with known encephalitis and history of brain biopsy in this area. No acute infarct. No mass or hemorrhage. Appearance stable compared to prior studies  Patient was observed in the ED for several hours and remained stable.  His work-up today is unrevealing.  Feel he is appropriate for discharge with close follow-up as an outpatient.  He is very eager and has been asked to be discharged since he arrived.  Discussed discharge with his legal guardian.  Advised on return precautions.  Final Clinical Impression(s) / ED Diagnoses Final diagnoses:  Seizure-like activity [facility])    Rx / DC Orders ED Discharge Orders    None       Karrie Meres, PA-C 11/05/19 1600    Derwood Kaplan, MD 11/05/19 1615

## 2019-11-05 NOTE — ED Triage Notes (Signed)
Per EMS patient from shelter, staff reports patient c/o dizziness and fell to the ground. Post ictal upon EMS arrival. Denies head, neck, and back pain. Hx autism.  CBG 134 BP 127/80 96% RA

## 2019-11-07 ENCOUNTER — Emergency Department (HOSPITAL_COMMUNITY): Payer: Medicaid Other

## 2019-11-07 ENCOUNTER — Emergency Department (HOSPITAL_COMMUNITY)
Admission: EM | Admit: 2019-11-07 | Discharge: 2019-11-07 | Disposition: A | Discharge status: Home / Self Care | Payer: Medicaid Other | Attending: Emergency Medicine | Admitting: Emergency Medicine

## 2019-11-07 ENCOUNTER — Encounter (HOSPITAL_COMMUNITY): Payer: Self-pay | Admitting: Emergency Medicine

## 2019-11-07 DIAGNOSIS — R569 Unspecified convulsions: Secondary | ICD-10-CM | POA: Insufficient documentation

## 2019-11-07 DIAGNOSIS — F840 Autistic disorder: Secondary | ICD-10-CM | POA: Insufficient documentation

## 2019-11-07 DIAGNOSIS — E039 Hypothyroidism, unspecified: Secondary | ICD-10-CM | POA: Insufficient documentation

## 2019-11-07 DIAGNOSIS — Z7722 Contact with and (suspected) exposure to environmental tobacco smoke (acute) (chronic): Secondary | ICD-10-CM | POA: Diagnosis not present

## 2019-11-07 DIAGNOSIS — Z79899 Other long term (current) drug therapy: Secondary | ICD-10-CM | POA: Insufficient documentation

## 2019-11-07 LAB — COMPREHENSIVE METABOLIC PANEL
ALT: 18 U/L (ref 0–44)
AST: 25 U/L (ref 15–41)
Albumin: 3.6 g/dL (ref 3.5–5.0)
Alkaline Phosphatase: 61 U/L (ref 38–126)
Anion gap: 9 (ref 5–15)
BUN: 16 mg/dL (ref 6–20)
CO2: 24 mmol/L (ref 22–32)
Calcium: 9.1 mg/dL (ref 8.9–10.3)
Chloride: 105 mmol/L (ref 98–111)
Creatinine, Ser: 0.76 mg/dL (ref 0.61–1.24)
GFR calc Af Amer: >60 mL/min (ref >60)
GFR calc non Af Amer: >60 mL/min (ref >60)
Glucose, Bld: 132 mg/dL — ABNORMAL HIGH (ref 70–99)
Potassium: 4.3 mmol/L (ref 3.5–5.1)
Sodium: 138 mmol/L (ref 135–145)
Total Bilirubin: 0.5 mg/dL (ref 0.3–1.2)
Total Protein: 7 g/dL (ref 6.5–8.1)

## 2019-11-07 LAB — CBC WITH DIFFERENTIAL/PLATELET
Abs Immature Granulocytes: 0.01 10*3/uL (ref 0.00–0.07)
Basophils Absolute: 0 10*3/uL (ref 0.0–0.1)
Basophils Relative: 1 %
Eosinophils Absolute: 0.2 10*3/uL (ref 0.0–0.5)
Eosinophils Relative: 5 %
HCT: 42.8 % (ref 39.0–52.0)
Hemoglobin: 13.9 g/dL (ref 13.0–17.0)
Immature Granulocytes: 0 %
Lymphocytes Relative: 42 %
Lymphs Abs: 1.9 10*3/uL (ref 0.7–4.0)
MCH: 29.3 pg (ref 26.0–34.0)
MCHC: 32.5 g/dL (ref 30.0–36.0)
MCV: 90.1 fL (ref 80.0–100.0)
Monocytes Absolute: 0.4 10*3/uL (ref 0.1–1.0)
Monocytes Relative: 10 %
Neutro Abs: 1.8 10*3/uL (ref 1.7–7.7)
Neutrophils Relative %: 42 %
Platelets: 158 10*3/uL (ref 150–400)
RBC: 4.75 MIL/uL (ref 4.22–5.81)
RDW: 12.7 % (ref 11.5–15.5)
WBC: 4.3 10*3/uL (ref 4.0–10.5)
nRBC: 0 % (ref 0.0–0.2)

## 2019-11-07 LAB — VALPROIC ACID LEVEL: Valproic Acid Lvl: 95 ug/mL (ref 50.0–100.0)

## 2019-11-07 LAB — URINALYSIS, ROUTINE W REFLEX MICROSCOPIC
Bilirubin Urine: NEGATIVE
Glucose, UA: NEGATIVE mg/dL
Hgb urine dipstick: NEGATIVE
Ketones, ur: NEGATIVE mg/dL
Leukocytes,Ua: NEGATIVE
Nitrite: NEGATIVE
Protein, ur: NEGATIVE mg/dL
Specific Gravity, Urine: 1.026 (ref 1.005–1.030)
pH: 6 (ref 5.0–8.0)

## 2019-11-07 LAB — RAPID URINE DRUG SCREEN, HOSP PERFORMED
Amphetamines: NOT DETECTED
Barbiturates: NOT DETECTED
Benzodiazepines: NOT DETECTED
Cocaine: NOT DETECTED
Opiates: NOT DETECTED
Tetrahydrocannabinol: NOT DETECTED

## 2019-11-07 MED ORDER — ACETAMINOPHEN 500 MG PO TABS
1000.0000 mg | ORAL_TABLET | Freq: Once | ORAL | Status: AC
  Administered 2019-11-07: 1000 mg via ORAL
  Filled 2019-11-07: qty 2

## 2019-11-07 NOTE — ED Notes (Signed)
IV removed by NT. 

## 2019-11-07 NOTE — ED Provider Notes (Signed)
[facility]-EMERGENCY DEPT Provider Note   CSN: [id number] Arrival date & time: 11/07/19  1203     History Chief Complaint  Patient presents with  . Seizures    David [name] is a 19 y.o. male.  19 year old male with a history of autism, intellectual disability, limbic encephalitis, speech delay, thyroid disease, seizures, pseudoseizures, who presents to the emergency department today for evaluation of a possible seizure.  Additional history obtained via EMS report.  Patient coming from a homeless shelter.  Staff stated that patient reported he was dizzy and then fell to the ground.  On EMS arrival patient was postictal.  He had a normal blood sugar and vital signs in route.  Upon arrival patient complaining of a mild frontal headache, initially not wanting to talk or provide much information.  Denies any other complaints.  Reports he has been taking his seizure medications regularly, is on Keppra and Depakote.  Patient does not remember having seizure or falling down..  Patient does report some midline low back pain but is unable to tell me if this was present before or after the seizure.  Denies numbness weakness or tingling, no loss of bowel or bladder control.  Was seen in the ED 2 days ago for similar and had a reassuring work-up.  Additional history obtained from chart review.        Past Medical History:  Diagnosis Date  . Autism   . Intellectual disability    Moderate  . Limbic encephalitis   . Seizures ([facility])   . Speech delay   . Thyroid disease     Patient Active Problem List   Diagnosis Date Noted  . Autism disorder 10/26/2019  . Recurrent seizures ([facility]) 10/23/2019  . 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 Seizure disorder ([facility]) 04/24/2018  . Seizure ([facility]) 04/24/2017  . Acne 11/03/2016  . Depression 11/03/2016  . Intellectual disability 11/03/2016  . 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 Epilepsy  ([facility]) 11/03/2016  . 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 Hypothyroidism 04/02/2008  . RHINITIS, ALLERGIC 07/26/2006    Past Surgical History:  Procedure Laterality Date  . BRAIN SURGERY     Brain biopsy  . GASTROSTOMY TUBE REVISION         No family history on file.  Social History   Tobacco Use  . Smoking status: Passive Smoke Exposure - Never Smoker  . Smokeless tobacco: Never Used  Substance Use Topics  . Alcohol use: No  . Drug use: No    Home Medications Prior to Admission medications   Medication Sig Start Date End Date Taking? Authorizing Provider  cholecalciferol (VITAMIN D3) 25 MCG (1000 UNIT) tablet Take 1 tablet (1,000 Units total) by mouth daily. 10/28/19  Yes Ghimire, Werner Lean, MD  divalproex (DEPAKOTE ER) 250 MG 24 hr tablet Take 5 tablets (1,250 mg total) by mouth 2 (two) times daily. 10/28/19 11/27/19 Yes Ghimire, Werner Lean, MD  levETIRAcetam (KEPPRA) 500 MG tablet Take 1 tablet (500 mg total) by mouth 2 (two) times daily. 10/28/19 11/27/19 Yes Ghimire, Werner Lean, MD  levothyroxine (SYNTHROID) 125 MCG tablet Take 0.5 tablets (62.5 mcg total) by mouth daily. 10/28/19 11/27/19 Yes Ghimire, Werner Lean, MD  risperiDONE (RISPERDAL) 0.5 MG tablet Take 1 tablet (0.5 mg total) by mouth 2 (two) times daily. 10/28/19  Yes Ghimire, Werner Lean, MD    Allergies    Penicillins  Review of Systems   Review  of Systems  Constitutional: Negative for chills and fever.  HENT: Negative.   Eyes: Negative for visual disturbance.  Respiratory: Negative for cough and shortness of breath.   Cardiovascular: Negative for chest pain.  Gastrointestinal: Negative for nausea and vomiting.  Genitourinary: Negative for dysuria.  Musculoskeletal: Positive for back pain. Negative for neck pain.  Skin: Negative for color change and rash.  Neurological: Positive for dizziness, seizures and headaches. Negative for weakness, light-headedness and numbness.    Physical Exam Updated  Vital Signs BP 113/62 (BP Location: Left Arm)   Pulse 60   Temp 98.3 F (36.8 C) (Oral)   Resp 18   SpO2 98%   Physical Exam Vitals and nursing note reviewed.  Constitutional:      General: He is not in acute distress.    Appearance: He is well-developed. He is not diaphoretic.     Comments: Patient is alert but appears to be somewhat postictal, no old to talk or provide much information, but is able to follow most commands.  HENT:     Head: Normocephalic and atraumatic.  Eyes:     General:        Right eye: No discharge.        Left eye: No discharge.     Extraocular Movements: Extraocular movements intact.     Pupils: Pupils are equal, round, and reactive to light.  Cardiovascular:     Rate and Rhythm: Normal rate and regular rhythm.     Heart sounds: Normal heart sounds. No murmur heard.  No friction rub. No gallop.   Pulmonary:     Effort: Pulmonary effort is normal. No respiratory distress.     Breath sounds: Normal breath sounds. No wheezing or rales.     Comments: Respirations equal and unlabored, patient able to speak in full sentences, lungs clear to auscultation bilaterally Abdominal:     General: Bowel sounds are normal. There is no distension.     Palpations: Abdomen is soft. There is no mass.     Tenderness: There is no abdominal tenderness. There is no guarding.     Comments: Abdomen soft, nondistended, nontender to palpation in all quadrants without guarding or peritoneal signs  Musculoskeletal:        General: No deformity.     Cervical back: Neck supple.     Comments: Midline lumbar spine tenderness without palpable deformity.  Skin:    General: Skin is warm and dry.     Capillary Refill: Capillary refill takes less than 2 seconds.  Neurological:     Mental Status: He is alert.     Coordination: Coordination normal.     Comments: Speech is clear, able to follow commands CN III-XII intact Normal strength in upper and lower extremities bilaterally  including dorsiflexion and plantar flexion, strong and equal grip strength Sensation normal to light and sharp touch Moves extremities without ataxia, coordination intact   Psychiatric:        Mood and Affect: Mood normal.        Behavior: Behavior normal.     ED Results / Procedures / Treatments   Labs (all labs ordered are listed, but only abnormal results are displayed) Labs Reviewed  COMPREHENSIVE METABOLIC PANEL - Abnormal; Notable for the following components:      Result Value   Glucose, Bld 132 (*)    All other components within normal limits  URINALYSIS, ROUTINE W REFLEX MICROSCOPIC  RAPID URINE DRUG SCREEN, HOSP PERFORMED  CBC WITH DIFFERENTIAL/PLATELET  VALPROIC ACID LEVEL    EKG None  Radiology DG Lumbar Spine Complete  Result Date: 11/07/2019 CLINICAL DATA:  Lumbar back pain. Possible fall. Witnessed seizure. EXAM: LUMBAR SPINE - COMPLETE 4+ VIEW COMPARISON:  None. FINDINGS: There are 4 non-rib-bearing lumbar vertebra. Mild levoscoliosis centered at the upper most non-rib-bearing lumbar vertebra. Segmentation anomaly involving the upper most non-rib-bearing lumbar vertebra with 2 left and single right transverse processes. No acute fracture. Vertebral body heights are preserved. No significant disc space narrowing. The sacroiliac joints are congruent. IMPRESSION: 1. No acute fracture. 2. Mild levoscoliosis. 3. 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 Electronically Signed   By: Narda Rutherford M.D.   On: 11/07/2019 16:16   CT Head Wo Contrast  Result Date: 11/07/2019 CLINICAL DATA:  Seizure EXAM: CT HEAD WITHOUT CONTRAST TECHNIQUE: Contiguous axial images were obtained from the base of the skull through the vertex without intravenous contrast. COMPARISON:  November 05, 2019 FINDINGS: Brain: Ventricular prominence for age, particular on the left, is stable  compared to 2 days prior. The left hemispheric atrophy noted previously with encephalomalacia primarily in the left temporal lobe region as well as involvement of the head of the caudate nucleus again noted. Multiple foci of calcifications in these areas are stable. No focal intracranial mass, hemorrhage, extra-axial fluid collection, or midline shift evident. No new foci of encephalomalacia. No acute infarct evident. Vascular: No hyperdense vessel.  No evident vascular calcification. Skull: Previous craniotomy on the left appear stable. Bony calvarium otherwise appears intact. Sinuses/Orbits: Mild mucosal thickening noted in several ethmoid air cells as well as mild mucosal thickening noted in the anterior sphenoid sinus regions. Visualized orbits appear symmetric bilaterally. Other: Visualized mastoid air cells are clear. IMPRESSION: Stable areas of encephalomalacia and calcification on the left with ventricular prominence for age, stable. No mass, hemorrhage, or acute appearing infarct evident. No new intracranial lesion. Prior craniotomy on the left. Foci of paranasal sinus disease as noted. Electronically Signed   By: Bretta Bang III M.D.   On: 11/07/2019 15:20    Procedures Procedures (including critical care time)  Medications Ordered in ED Medications  acetaminophen (TYLENOL) tablet 1,000 mg (1,000 mg Oral Given 11/07/19 1902)    ED Course  I have reviewed the triage vital signs and the nursing notes.  Pertinent labs & imaging results that were available during my care of the patient were reviewed by me and considered in my medical decision making (see chart for details).    MDM Rules/Calculators/A&P                          19 year old male presenting for evaluation of possible seizure. Reportedly at the homeless shelter prior to arrival patient complained of dizziness and then f was reportedly assisted to the ground by staff, but patient is complaining of frontal headache.   Postictal upon EMS arrival..  On my assessment patient is initially not wanting to talk and still appears somewhat postictal, but during ED stay he returned to his baseline.  He is neurologically intact at this time.  We will get labs, valproic acid level, will get CT head due to persistent frontal headache.  I have independently ordered, reviewed and interpreted all labs and imaging:  Initial CBG is normal CBC nonacute CMP nonacute Valproic acid level: Was slightly elevated 2 days ago, has returned to normal UA and UDS unremarkable  CT  head with left hemispheric atrophy with encephalomalacia, stable, with multiple areas of dystrophic appearing calcification. Mild dystrophic appearing calcification is noted in the head of the caudate nucleus on the left as well, stable.  These changes are chronic and stable when compared to previous studies.  Patient was observed in the ED for several hours and remained stable.  His work-up today is unrevealing.  Feel he is appropriate for discharge with close follow-up as an outpatient.    Patient is eating, drinking and watching TV, back at baseline.  Left message with guardian, case management has arranged transportation for patient home.  Discharged in good condition.  Final Clinical Impression(s) / ED Diagnoses Final diagnoses:  Seizure-like activity [facility])    Rx / DC Orders ED Discharge Orders    None       Dartha Lodge, [place] 11/09/19 0041    Milagros Loll, MD 11/11/19 [phone]

## 2019-11-07 NOTE — Progress Notes (Addendum)
TOC CM spoke to Arnetha Courser, DSS Legal Guardian. # [phone]. She is in the process to get his SSI restarted. Pt has needs funds before he can get into group home. She working with her department on housing. Pt can go back to [employer] once [employer].Will need transportation back to [employer]. She states pt is on medication for seizures. Discussed compliance and the shelter is giving pt his meds. States she was attempted to secure him a PCP. She wants CM to get an appt. Verbal Consent for [facility] at [employer].  Isidoro Donning RN CCM, [facility] ED TOC CM [phone]

## 2019-11-07 NOTE — ED Notes (Signed)
Patient provided with urinal and made aware urine sample is needed. 

## 2019-11-07 NOTE — ED Triage Notes (Signed)
Per EMS, patient from shelter, witnessed seizure by staff, assisted to the ground. Post ictal upon EMS arrival.   18g L FA

## 2019-11-07 NOTE — ED Notes (Signed)
Case manager calling taxi and providing voucher for patient at this time.

## 2019-11-07 NOTE — Discharge Instructions (Signed)
Continue taking your seizure medications as directed.  Follow-up with your neurologist.  Do not drive or swim, as these could be life-threatening if you had a seizure during this.  Return to the emergency department if you have further seizures or other new or concerning symptoms.

## 2019-11-07 NOTE — ED Notes (Addendum)
Case manager contacted, who reports she will bring taxi voucher for patient.

## 2019-11-07 NOTE — ED Notes (Signed)
Patient given sandwich.  

## 2019-11-07 NOTE — ED Notes (Signed)
Patient transported to CT 

## 2019-11-07 NOTE — ED Notes (Signed)
Per case manager, will contact her at time of discharge to arrange transport back to shelter.

## 2019-11-07 NOTE — ED Notes (Signed)
Pt given urinal; unable to void. Will continue to monitor.

## 2019-11-12 ENCOUNTER — Emergency Department (HOSPITAL_COMMUNITY)
Admission: EM | Admit: 2019-11-12 | Discharge: 2019-11-12 | Disposition: A | Discharge status: Home / Self Care | Payer: Medicaid Other | Attending: Emergency Medicine | Admitting: Emergency Medicine

## 2019-11-12 ENCOUNTER — Encounter (HOSPITAL_COMMUNITY): Payer: Self-pay | Admitting: Student

## 2019-11-12 ENCOUNTER — Emergency Department (HOSPITAL_COMMUNITY): Payer: Medicaid Other

## 2019-11-12 ENCOUNTER — Other Ambulatory Visit: Payer: Self-pay

## 2019-11-12 DIAGNOSIS — Z880 Allergy status to penicillin: Secondary | ICD-10-CM | POA: Insufficient documentation

## 2019-11-12 DIAGNOSIS — E039 Hypothyroidism, unspecified: Secondary | ICD-10-CM | POA: Diagnosis not present

## 2019-11-12 DIAGNOSIS — F840 Autistic disorder: Secondary | ICD-10-CM | POA: Insufficient documentation

## 2019-11-12 DIAGNOSIS — Z87891 Personal history of nicotine dependence: Secondary | ICD-10-CM | POA: Diagnosis not present

## 2019-11-12 DIAGNOSIS — R569 Unspecified convulsions: Secondary | ICD-10-CM | POA: Diagnosis present

## 2019-11-12 LAB — COMPREHENSIVE METABOLIC PANEL
ALT: 17 U/L (ref 0–44)
AST: 22 U/L (ref 15–41)
Albumin: 3.8 g/dL (ref 3.5–5.0)
Alkaline Phosphatase: 64 U/L (ref 38–126)
Anion gap: 8 (ref 5–15)
BUN: 14 mg/dL (ref 6–20)
CO2: 28 mmol/L (ref 22–32)
Calcium: 9.3 mg/dL (ref 8.9–10.3)
Chloride: 103 mmol/L (ref 98–111)
Creatinine, Ser: 0.89 mg/dL (ref 0.61–1.24)
GFR calc Af Amer: >60 mL/min (ref >60)
GFR calc non Af Amer: >60 mL/min (ref >60)
Glucose, Bld: 92 mg/dL (ref 70–99)
Potassium: 4.2 mmol/L (ref 3.5–5.1)
Sodium: 139 mmol/L (ref 135–145)
Total Bilirubin: 0.4 mg/dL (ref 0.3–1.2)
Total Protein: 7.5 g/dL (ref 6.5–8.1)

## 2019-11-12 LAB — URINALYSIS, ROUTINE W REFLEX MICROSCOPIC
Bilirubin Urine: NEGATIVE
Glucose, UA: NEGATIVE mg/dL
Hgb urine dipstick: NEGATIVE
Ketones, ur: 5 mg/dL — AB
Leukocytes,Ua: NEGATIVE
Nitrite: NEGATIVE
Protein, ur: NEGATIVE mg/dL
Specific Gravity, Urine: 1.026 (ref 1.005–1.030)
pH: 8 (ref 5.0–8.0)

## 2019-11-12 LAB — CBC WITH DIFFERENTIAL/PLATELET
Abs Immature Granulocytes: 0.02 10*3/uL (ref 0.00–0.07)
Basophils Absolute: 0 10*3/uL (ref 0.0–0.1)
Basophils Relative: 1 %
Eosinophils Absolute: 0.3 10*3/uL (ref 0.0–0.5)
Eosinophils Relative: 6 %
HCT: 45.4 % (ref 39.0–52.0)
Hemoglobin: 14.7 g/dL (ref 13.0–17.0)
Immature Granulocytes: 1 %
Lymphocytes Relative: 43 %
Lymphs Abs: 1.9 10*3/uL (ref 0.7–4.0)
MCH: 29.4 pg (ref 26.0–34.0)
MCHC: 32.4 g/dL (ref 30.0–36.0)
MCV: 90.8 fL (ref 80.0–100.0)
Monocytes Absolute: 0.6 10*3/uL (ref 0.1–1.0)
Monocytes Relative: 13 %
Neutro Abs: 1.6 10*3/uL — ABNORMAL LOW (ref 1.7–7.7)
Neutrophils Relative %: 36 %
Platelets: 159 10*3/uL (ref 150–400)
RBC: 5 MIL/uL (ref 4.22–5.81)
RDW: 12.9 % (ref 11.5–15.5)
WBC: 4.4 10*3/uL (ref 4.0–10.5)
nRBC: 0 % (ref 0.0–0.2)

## 2019-11-12 LAB — RAPID URINE DRUG SCREEN, HOSP PERFORMED
Amphetamines: NOT DETECTED
Barbiturates: NOT DETECTED
Benzodiazepines: NOT DETECTED
Cocaine: NOT DETECTED
Opiates: NOT DETECTED
Tetrahydrocannabinol: NOT DETECTED

## 2019-11-12 LAB — CBG MONITORING, ED: Glucose-Capillary: 86 mg/dL (ref 70–99)

## 2019-11-12 LAB — VALPROIC ACID LEVEL: Valproic Acid Lvl: 107 ug/mL — ABNORMAL HIGH (ref 50.0–100.0)

## 2019-11-12 LAB — AMMONIA: Ammonia: 51 umol/L — ABNORMAL HIGH (ref 9–35)

## 2019-11-12 NOTE — ED Notes (Signed)
Patient transported to CT 

## 2019-11-12 NOTE — ED Notes (Signed)
Ambulated out in hallway. Given sandwhich and coke.

## 2019-11-12 NOTE — ED Provider Notes (Signed)
[facility] DEPT Provider Note   CSN: [id number] Arrival date & time: 11/12/19  1113     History Chief Complaint  Patient presents with  . Seizures    David [name] is a 19 y.o. male.  HPI 19 year old male with a history of autism, intellectual disability, limbic encephalitis/NMDA receptor encephalitis, speech delay, thyroid disease, seizures, pseudoseizures, who presents to the emergency department today for evaluation of a possible seizure.  Additional history obtained via EMS report.  Patient coming from a homeless shelter.   Per EMS report patient was not postictal on arrival.  Had no additional episodes of seizure-like activity during transport.  Level 4 caveat AMS d/t postictal behavior.  On my review of EMR patient had a EEG study done during his last hospitalization.  His EEG done on 10/26/2019 showed no epileptiform or seizure-like activity on EEG.  During his monitoring he had an event which included nonrhythmic whole body jerking for several seconds which ceased after the RN asked him to stop.  He has noted to have a history of 2 seizures and therefore should continue the on valproic acid and keppra.  At that time Dr. Lorraine Lax of neurology provided patient with recommendations to continue Keppra 500 mg twice daily and Depakote dose 1250 BID and recommended psychiatry consult for behavior/PNES it appears that Risperdal 0.5 mg BID was recommended for agitation.       Past Medical History:  Diagnosis Date  . Autism   . Intellectual disability    Moderate  . Limbic encephalitis   . Seizures ([place])   . Speech delay   . Thyroid disease     Patient Active Problem List   Diagnosis Date Noted  . Autism disorder 10/26/2019  . Recurrent seizures ([place]) 10/23/2019  . 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 Seizure disorder ([place]) 04/24/2018  . Seizure ([place]) 04/24/2017  . Acne 11/03/2016  . Depression  11/03/2016  . Intellectual disability 11/03/2016  . 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 Epilepsy ([place]) 11/03/2016  . 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 Hypothyroidism 04/02/2008  . RHINITIS, ALLERGIC 07/26/2006    Past Surgical History:  Procedure Laterality Date  . BRAIN SURGERY     Brain biopsy  . GASTROSTOMY TUBE REVISION         History reviewed. No pertinent family history.  Social History   Tobacco Use  . Smoking status: Passive Smoke Exposure - Never Smoker  . Smokeless tobacco: Never Used  Substance Use Topics  . Alcohol use: No  . Drug use: No    Home Medications Prior to Admission medications   Medication Sig Start Date End Date Taking? Authorizing Provider  cholecalciferol (VITAMIN D3) 25 MCG (1000 UNIT) tablet Take 1 tablet (1,000 Units total) by mouth daily. 10/28/19  Yes Ghimire, Henreitta Leber, MD  divalproex (DEPAKOTE ER) 250 MG 24 hr tablet Take 5 tablets (1,250 mg total) by mouth 2 (two) times daily. 10/28/19 11/27/19 Yes Ghimire, Henreitta Leber, MD  levETIRAcetam (KEPPRA) 500 MG tablet Take 1 tablet (500 mg total) by mouth 2 (two) times daily. 10/28/19 11/27/19 Yes Ghimire, Henreitta Leber, MD  levothyroxine (SYNTHROID) 125 MCG tablet Take 0.5 tablets (62.5 mcg total) by mouth daily. 10/28/19 11/27/19 Yes Ghimire, Henreitta Leber, MD  risperiDONE (RISPERDAL) 0.5 MG tablet Take 1 tablet (0.5 mg total) by mouth 2 (two) times daily. 10/28/19  Yes Ghimire, Henreitta Leber, MD    Allergies    Penicillins  Review of Systems   Review of Systems  Constitutional: Negative for chills and fever.  HENT: Negative for congestion.   Eyes: Negative for pain.  Respiratory: Negative for cough and shortness of breath.   Cardiovascular: Negative for chest pain and leg swelling.  Gastrointestinal: Negative for abdominal pain and vomiting.  Genitourinary: Negative for dysuria.  Musculoskeletal: Negative for myalgias.       Right ankle pain  Skin: Negative for rash.  Neurological:  Positive for headaches. Negative for dizziness.    Physical Exam Updated Vital Signs BP (!) 137/55   Pulse 62   Temp 98.5 F (36.9 C)   Resp (!) 28   SpO2 98%   Physical Exam Vitals and nursing note reviewed.  Constitutional:      General: He is not in acute distress.    Comments: 19 year old male in no acute distress.  Sitting comfortably in bed.  Requires significant encouragement to answer questions but will whisper yes, no and shake his head to questions.  HENT:     Head: Normocephalic and atraumatic.     Nose: Nose normal.  Eyes:     General: No scleral icterus. Cardiovascular:     Rate and Rhythm: Normal rate and regular rhythm.     Pulses: Normal pulses.     Heart sounds: Normal heart sounds.     Comments: Symmetric 3+ DP/PT/radial pulses Pulmonary:     Effort: Pulmonary effort is normal. No respiratory distress.     Breath sounds: No wheezing.  Abdominal:     Palpations: Abdomen is soft.     Tenderness: There is no abdominal tenderness. There is no guarding or rebound.  Musculoskeletal:     Cervical back: Normal range of motion.     Right lower leg: No edema.     Left lower leg: No edema.     Comments: Mild posterior right ankle tenderness to palpation.  Full range of motion ankle  Skin:    General: Skin is warm and dry.     Capillary Refill: Capillary refill takes less than 2 seconds.  Neurological:     Mental Status: He is alert. Mental status is at baseline.     Comments: Sensation intact all 4 extremities. Patient is moving all 4 extremities Cranial nerves grossly intact. Finger-nose intact.  Patient deferred heel-to-shin due to ankle pain.  Gait also deferred at this time.  Patient is oriented to place, self, events but is unable to say the year--is aware that it is June.  Psychiatric:        Mood and Affect: Mood normal.        Behavior: Behavior normal.     ED Results / Procedures / Treatments   Labs (all labs ordered are listed, but only  abnormal results are displayed) Labs Reviewed  CBC WITH DIFFERENTIAL/PLATELET - Abnormal; Notable for the following components:      Result Value   Neutro Abs 1.6 (*)    All other components within normal limits  VALPROIC ACID LEVEL - Abnormal; Notable for the following components:   Valproic Acid Lvl 107 (*)    All other components within normal limits  AMMONIA - Abnormal; Notable for the following components:   Ammonia 51 (*)    All other components within normal limits  COMPREHENSIVE METABOLIC PANEL  LEVETIRACETAM LEVEL  URINALYSIS, ROUTINE W REFLEX MICROSCOPIC  RAPID URINE DRUG SCREEN, HOSP PERFORMED  CBG MONITORING, ED    EKG EKG Interpretation  Date/Time:  Wednesday November 12 2019 11:46:04 EDT Ventricular Rate:  68 PR Interval:    QRS Duration: 93 QT Interval:  348 QTC Calculation: 370 R Axis:   68 Text Interpretation: Sinus rhythm ST elev, probable normal early repol pattern Unchanged from prior. No STEMI Confirmed by Alona Bene [id number]) on 11/12/2019 11:48:55 AM   Radiology DG Ankle Complete Right  Result Date: 11/12/2019 CLINICAL DATA:  Witnessed seizure with fall.  Ankle pain. EXAM: RIGHT ANKLE - COMPLETE 3+ VIEW COMPARISON:  None. FINDINGS: The mineralization and alignment are normal. There is no evidence of acute fracture or dislocation. The joint spaces are preserved. Minimal lateral soft tissue swelling without evidence of foreign body. IMPRESSION: No acute osseous findings. Electronically Signed   By: Carey Bullocks M.D.   On: 11/12/2019 12:42   CT Head Wo Contrast  Result Date: 11/12/2019 CLINICAL DATA:  Seizures. EXAM: CT HEAD WITHOUT CONTRAST CT CERVICAL SPINE WITHOUT CONTRAST TECHNIQUE: Multidetector CT imaging of the head and cervical spine was performed following the standard protocol without intravenous contrast. Multiplanar CT image reconstructions of the cervical spine were also generated. COMPARISON:  November 07, 2019. FINDINGS: CT HEAD FINDINGS Brain:  Stable left temporal encephalomalacia is noted with associated cortical calcifications. No mass effect or midline shift is noted. Ventricular size is within normal limits. There is no evidence of hemorrhage, acute infarction or mass lesion. Vascular: No hyperdense vessel or unexpected calcification. Skull: Stable left temporal craniotomy. No acute abnormality is noted. Sinuses/Orbits: No acute finding. Other: None. CT CERVICAL SPINE FINDINGS Alignment: Normal. Skull base and vertebrae: No acute fracture. No primary bone lesion or focal pathologic process. Soft tissues and spinal canal: No prevertebral fluid or swelling. No visible canal hematoma. Disc levels:  Normal. Upper chest: Negative. Other: None. IMPRESSION: 1. Stable left temporal encephalomalacia is noted with associated cortical calcifications. Stable left temporal craniotomy. No acute intracranial abnormality seen. 2. Normal cervical spine. Electronically Signed   By: Lupita Raider M.D.   On: 11/12/2019 12:28   CT Cervical Spine Wo Contrast  Result Date: 11/12/2019 CLINICAL DATA:  Seizures. EXAM: CT HEAD WITHOUT CONTRAST CT CERVICAL SPINE WITHOUT CONTRAST TECHNIQUE: Multidetector CT imaging of the head and cervical spine was performed following the standard protocol without intravenous contrast. Multiplanar CT image reconstructions of the cervical spine were also generated. COMPARISON:  November 07, 2019. FINDINGS: CT HEAD FINDINGS Brain: Stable left temporal encephalomalacia is noted with associated cortical calcifications. No mass effect or midline shift is noted. Ventricular size is within normal limits. There is no evidence of hemorrhage, acute infarction or mass lesion. Vascular: No hyperdense vessel or unexpected calcification. Skull: Stable left temporal craniotomy. No acute abnormality is noted. Sinuses/Orbits: No acute finding. Other: None. CT CERVICAL SPINE FINDINGS Alignment: Normal. Skull base and vertebrae: No acute fracture. No primary  bone lesion or focal pathologic process. Soft tissues and spinal canal: No prevertebral fluid or swelling. No visible canal hematoma. Disc levels:  Normal. Upper chest: Negative. Other: None. IMPRESSION: 1. Stable left temporal encephalomalacia is noted with associated cortical calcifications. Stable left temporal craniotomy. No acute intracranial abnormality seen. 2. Normal cervical spine. Electronically Signed   By: Lupita Raider M.D.   On: 11/12/2019 12:28    Procedures Procedures (including critical care time)  Medications Ordered in ED Medications - No data to display  ED Course  I have reviewed the triage vital signs and the nursing notes.  Pertinent labs & imaging results that were available during my care of the patient were reviewed  by me and considered in my medical decision making (see chart for details).  Patient is 19 year old male presented today with seizure-like activity from [facility], shelter.  Patient has a history of seizures and has been evaluated by neurology during past hospitalization.  He had EEG confirmed pseudoseizure activity however per Dr. Laurence Slate of neurology's note patient does also have confirmed seizures.  He should continue his antiepileptic medication regimen and appears that he is taking his medications as prescribed given that he has a positive valproic acid level.  Patient is questionably postictal although he was not altered in any way with EMS per report-I have some concern for malingering.  Will obtain labs, urinalysis, UDS, head CT, neck CT and right ankle x-ray  Physical exam is relatively unremarkable.  Patient does participate in my neuro exam but is reticent about answering questions.  He will sometimes answer questions and sometimes whispered them and other times the answer at all.  However he was able to verbalize that he wanted the light turned off upon my exit room.  Patient does have some right ankle posterior tenderness to palpation.   Plain film x-ray of right ankle was obtained for ruling out fracture.  Clinical Course as of Nov 12 1506  Wed Nov 12, 2019  1356 CT head and neck without any acute abnormality.  Agree with radiology read.  1. Stable left temporal encephalomalacia is noted with associated cortical calcifications. Stable left temporal craniotomy. No acute intracranial abnormality seen. 2. Normal cervical spine.   [WF]  1356 Right ankle x-ray without any bony abnormality.  No fracture.   [WF]  1359 EKG appears grossly unchanged from prior.  No evidence of ischemia.  There is benign early repole.   [WF]  1400 Without leukocytosis or anemia.   [WF]  1400 Valproic acid level is positive indicating the patient is likely taking his medications.  Keppra level pending at this time.   [WF]  1400 CMP without electrolyte abnormality.   [WF]  1410 Attempted phone call the patient's legal guardian DSS Case worker - Elita Boone [phone] Message left.  No answer.   [WF]  1410 I discussed this case with my attending physician who cosigned this note including patient's presenting symptoms, physical exam, and planned diagnostics and interventions. Attending physician stated agreement with plan or made changes to plan which were implemented.   Attending physician assessed patient at bedside.   [WF]  1507 Discussed with DDS who is in charge of pt case. She will pick patient up form [facility] hospital.   [WF]    Clinical Course User Index [WF] Gailen Shelter, [place]   MDM Rules/Calculators/A&P                          Patient discharged at this time.  He is ambulatory, seems to be at mental baseline.  He was notably very active, walking around and able to talk and interact with evaluator once he was told that his guardian is here for picking him up.  Patient will follow up with his neurologist and primary care doctor.  Stable for discharge at this time.  Final Clinical Impression(s) / ED Diagnoses Final diagnoses:    Seizure-like activity [facility])    Rx / DC Orders ED Discharge Orders    None       Gailen Shelter, [place] 11/13/19 1918    Gerhard Munch, MD 11/13/19 2235

## 2019-11-12 NOTE — Progress Notes (Signed)
TOC CM spoke to legal guardian, Arnetha Courser. States she picked up his meds from pharmacy and will pick him up out front. ED RN made aware. Isidoro Donning RN CCM, [facility] ED TOC CM [phone]

## 2019-11-12 NOTE — ED Notes (Signed)
Patient arrives via [facility] c/o seizures from [facility].  Witness seizure that lasted approximately 10 minutes before EMS arrival.  Patient had two pseudoseizures with EMS. Was not postictal at all with EMS.  Refuses to verbally respond, will only shake his head no/yes.  Possible fall on scene, was laying in floor on his side.  Refused IV and C-collar with EMS.

## 2019-11-12 NOTE — Discharge Instructions (Addendum)
Please continue take your seizure medications as prescribed.  Please follow-up with your primary care doctor/neurologist.  Discussed with your guardian Elita Boone

## 2019-11-13 ENCOUNTER — Emergency Department (HOSPITAL_COMMUNITY)
Admission: EM | Admit: 2019-11-13 | Discharge: 2019-11-13 | Disposition: A | Discharge status: Home / Self Care | Payer: Medicaid Other | Attending: Emergency Medicine | Admitting: Emergency Medicine

## 2019-11-13 ENCOUNTER — Encounter (HOSPITAL_COMMUNITY): Payer: Self-pay | Admitting: Emergency Medicine

## 2019-11-13 DIAGNOSIS — M79602 Pain in left arm: Secondary | ICD-10-CM | POA: Insufficient documentation

## 2019-11-13 DIAGNOSIS — M79601 Pain in right arm: Secondary | ICD-10-CM | POA: Diagnosis not present

## 2019-11-13 DIAGNOSIS — Z5321 Procedure and treatment not carried out due to patient leaving prior to being seen by health care provider: Secondary | ICD-10-CM | POA: Insufficient documentation

## 2019-11-13 DIAGNOSIS — Z590 Homelessness: Secondary | ICD-10-CM | POA: Diagnosis present

## 2019-11-13 NOTE — Progress Notes (Addendum)
TOC CM contacted pt's legal DSS guardian, Aram Beecham to make aware that pt is ready for dc. Pt has an appt for [facility] on 11/24/2019 to see NP, Gwinda Passe. Waiting call back from legal guardian concerning transportation back to [facility].   Isidoro Donning RN CCM, [facility] ED TOC CM [phone]  331 pm Received consent from Arnetha Courser to transport pt back to [facility]. Waiver signed by myself and Vivi Barrack CSW. Emailed to [employer]. Isidoro Donning RN CCM, [facility] ED TOC CM [phone]

## 2019-11-13 NOTE — ED Triage Notes (Signed)
Per EMS-patient is homeless, coming from shelter-complaining of B/L arm pain and edema-states it just happened today

## 2019-11-14 LAB — LEVETIRACETAM LEVEL: Levetiracetam Lvl: 9.5 ug/mL — ABNORMAL LOW (ref 10.0–40.0)

## 2019-11-18 ENCOUNTER — Emergency Department (HOSPITAL_COMMUNITY)
Admission: EM | Admit: 2019-11-18 | Discharge: 2019-11-18 | Disposition: A | Discharge status: Home / Self Care | Payer: Medicaid Other | Attending: Emergency Medicine | Admitting: Emergency Medicine

## 2019-11-18 ENCOUNTER — Other Ambulatory Visit: Payer: Self-pay

## 2019-11-18 DIAGNOSIS — E039 Hypothyroidism, unspecified: Secondary | ICD-10-CM | POA: Insufficient documentation

## 2019-11-18 DIAGNOSIS — G40909 Epilepsy, unspecified, not intractable, without status epilepticus: Secondary | ICD-10-CM | POA: Diagnosis present

## 2019-11-18 DIAGNOSIS — Z7722 Contact with and (suspected) exposure to environmental tobacco smoke (acute) (chronic): Secondary | ICD-10-CM | POA: Insufficient documentation

## 2019-11-18 DIAGNOSIS — R569 Unspecified convulsions: Secondary | ICD-10-CM

## 2019-11-18 DIAGNOSIS — Z880 Allergy status to penicillin: Secondary | ICD-10-CM | POA: Diagnosis not present

## 2019-11-18 DIAGNOSIS — R251 Tremor, unspecified: Secondary | ICD-10-CM | POA: Insufficient documentation

## 2019-11-18 LAB — CBC WITH DIFFERENTIAL/PLATELET
Abs Immature Granulocytes: 0.01 10*3/uL (ref 0.00–0.07)
Basophils Absolute: 0 10*3/uL (ref 0.0–0.1)
Basophils Relative: 1 %
Eosinophils Absolute: 0.2 10*3/uL (ref 0.0–0.5)
Eosinophils Relative: 5 %
HCT: 46.2 % (ref 39.0–52.0)
Hemoglobin: 15.2 g/dL (ref 13.0–17.0)
Immature Granulocytes: 0 %
Lymphocytes Relative: 40 %
Lymphs Abs: 1.7 10*3/uL (ref 0.7–4.0)
MCH: 29.7 pg (ref 26.0–34.0)
MCHC: 32.9 g/dL (ref 30.0–36.0)
MCV: 90.2 fL (ref 80.0–100.0)
Monocytes Absolute: 0.6 10*3/uL (ref 0.1–1.0)
Monocytes Relative: 15 %
Neutro Abs: 1.6 10*3/uL — ABNORMAL LOW (ref 1.7–7.7)
Neutrophils Relative %: 39 %
Platelets: 164 10*3/uL (ref 150–400)
RBC: 5.12 MIL/uL (ref 4.22–5.81)
RDW: 12.8 % (ref 11.5–15.5)
WBC: 4.1 10*3/uL (ref 4.0–10.5)
nRBC: 0 % (ref 0.0–0.2)

## 2019-11-18 LAB — COMPREHENSIVE METABOLIC PANEL
ALT: 15 U/L (ref 0–44)
AST: 23 U/L (ref 15–41)
Albumin: 3.9 g/dL (ref 3.5–5.0)
Alkaline Phosphatase: 63 U/L (ref 38–126)
Anion gap: 8 (ref 5–15)
BUN: 13 mg/dL (ref 6–20)
CO2: 27 mmol/L (ref 22–32)
Calcium: 9.3 mg/dL (ref 8.9–10.3)
Chloride: 104 mmol/L (ref 98–111)
Creatinine, Ser: 0.9 mg/dL (ref 0.61–1.24)
GFR calc Af Amer: >60 mL/min (ref >60)
GFR calc non Af Amer: >60 mL/min (ref >60)
Glucose, Bld: 117 mg/dL — ABNORMAL HIGH (ref 70–99)
Potassium: 4.3 mmol/L (ref 3.5–5.1)
Sodium: 139 mmol/L (ref 135–145)
Total Bilirubin: 0.5 mg/dL (ref 0.3–1.2)
Total Protein: 7.7 g/dL (ref 6.5–8.1)

## 2019-11-18 MED ORDER — LEVETIRACETAM 500 MG PO TABS
500.0000 mg | ORAL_TABLET | Freq: Once | ORAL | Status: AC
  Administered 2019-11-18: 500 mg via ORAL
  Filled 2019-11-18: qty 1

## 2019-11-18 NOTE — ED Triage Notes (Signed)
EMS reports coming from [facility], staff reports seizure, hx of. No fall, no LOC no obvious injury. EMS reports Pt was responsive to sternal rub and opened eyes during what was described as Pseudoseizure. Unclear whether Pt in post-ictal state on arrival  BP 130/82 HR 70 RR 20 Sp02 96 RA CBG 100

## 2019-11-18 NOTE — ED Notes (Signed)
Arnetha Courser, pts legal guardian, notified of pts discharge status.  Per Aram Beecham, pt is to have transportation arranged by ED staff back to [facility], as has been done in the past.

## 2019-11-18 NOTE — Progress Notes (Signed)
TOC CM spoke to pt's DSS Legal Guardian, Arnetha Courser, states she will pick pt up from [facility] ED and transport him back to [facility]. ED provider updated. Isidoro Donning RN CCM, [facility] ED TOC CM [phone]

## 2019-11-18 NOTE — ED Provider Notes (Signed)
[facility] DEPT Provider Note   CSN: [id number] Arrival date & time: 11/18/19  1217     History No chief complaint on file.   [name] is a 19 y.o. male.  HPI  Patient is a 19 year old male with history of autism, intellectual disability, seizures, speech delay, thyroid disease   Patient is going from [employer] today with staff reports a seizure like activity.  Per EMS there was no fall and no loss of consciousness or obvious injury and during seizure-like activity did react to sternal rub with opening of eyes and purposeful movements.  Patient was questionably postictal after the event and during transport.  Per EMS patient would have rudimentary intractability and that he would follow basic commands and seemed most comfortable playing currently in the stretcher.  Patient is baseline nonverbal but is able to nod head yes is a now 2 days of questions.  He denies any pain, fevers, chest pain, redness, abdominal pain, dizziness, vision changes.  He denies any neck pain.     Past Medical History:  Diagnosis Date  . Autism   . Intellectual disability    Moderate  . Limbic encephalitis   . Seizures ([place])   . Speech delay   . Thyroid disease     Patient Active Problem List   Diagnosis Date Noted  . Autism disorder 10/26/2019  . Recurrent seizures ([place]) 10/23/2019  . 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 Seizure disorder ([place]) 04/24/2018  . Seizure ([place]) 04/24/2017  . Acne 11/03/2016  . Depression 11/03/2016  . Intellectual disability 11/03/2016  . 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 Hypothyroidism 04/02/2008  . RHINITIS, ALLERGIC 07/26/2006    Past Surgical History:  Procedure Laterality Date  . BRAIN SURGERY     Brain biopsy  . GASTROSTOMY TUBE REVISION         No family history on  file.  Social History   Tobacco Use  . Smoking status: Passive Smoke Exposure - Never Smoker  . Smokeless tobacco: Never Used  Substance Use Topics  . Alcohol use: No  . Drug use: No    Home Medications Prior to Admission medications   Medication Sig Start Date End Date Taking? Authorizing Provider  cholecalciferol (VITAMIN D3) 25 MCG (1000 UNIT) tablet Take 1 tablet (1,000 Units total) by mouth daily. 10/28/19  Yes Ghimire, Henreitta Leber, MD  divalproex (DEPAKOTE ER) 250 MG 24 hr tablet Take 5 tablets (1,250 mg total) by mouth 2 (two) times daily. 10/28/19 11/27/19 Yes Ghimire, Henreitta Leber, MD  levETIRAcetam (KEPPRA) 500 MG tablet Take 1 tablet (500 mg total) by mouth 2 (two) times daily. 10/28/19 11/27/19 Yes Ghimire, Henreitta Leber, MD  levothyroxine (SYNTHROID) 125 MCG tablet Take 0.5 tablets (62.5 mcg total) by mouth daily. 10/28/19 11/27/19 Yes Ghimire, Henreitta Leber, MD  risperiDONE (RISPERDAL) 0.5 MG tablet Take 1 tablet (0.5 mg total) by mouth 2 (two) times daily. 10/28/19  Yes Ghimire, Henreitta Leber, MD    Allergies    Penicillins  Review of Systems   Review of Systems  Reason unable to perform ROS: pt nonverbal - ROS obtained by asking Q and pt shaking head yes or no.  Constitutional: Negative for chills and fever.  HENT: Negative for congestion.   Eyes: Negative for pain.  Respiratory: Negative for cough and shortness of breath.   Cardiovascular: Negative for chest  pain and leg swelling.  Gastrointestinal: Negative for abdominal pain and vomiting.  Genitourinary: Negative for dysuria.  Musculoskeletal: Negative for myalgias.  Skin: Negative for rash.  Neurological: Negative for dizziness and headaches.       Shaking spell    Physical Exam Updated Vital Signs BP (!) 119/99   Pulse 81   Temp 98.3 F (36.8 C) (Oral)   Resp 15   SpO2 98%   Physical Exam Vitals and nursing note reviewed.  Constitutional:      General: He is not in acute distress.    Comments: Patient is a 19 year old male.   She is verbal at baseline but able to nod yes or no to questions.  Smiles during examination, does not appear to be in any acute distress or pain.  HENT:     Head: Normocephalic and atraumatic.     Nose: Nose normal.  Eyes:     General: No scleral icterus. Cardiovascular:     Rate and Rhythm: Normal rate and regular rhythm.     Pulses: Normal pulses.     Heart sounds: Normal heart sounds.     Comments: Radial and DP pulses palpated BL.  Pulmonary:     Effort: Pulmonary effort is normal. No respiratory distress.     Breath sounds: No wheezing.  Abdominal:     Palpations: Abdomen is soft.     Tenderness: There is no abdominal tenderness.  Musculoskeletal:     Cervical back: Normal range of motion.     Right lower leg: No edema.     Left lower leg: No edema.     Comments: No bony tenderness over joints or long bones of the upper and lower extremities.     No neck or back midline tenderness, step-off, deformity, or bruising. Able to turn head left and right 45 degrees without difficulty.  Full range of motion of upper and lower extremity joints shown after palpation was conducted; with 5/5 symmetrical strength in upper and lower extremities. No chest wall tenderness, no facial or cranial tenderness.   Skin:    General: Skin is warm and dry.     Capillary Refill: Capillary refill takes less than 2 seconds.  Neurological:     Mental Status: He is alert. Mental status is at baseline.     Comments: Patient has intact sensation grossly in lower and upper extremities.   Psychiatric:        Mood and Affect: Mood normal.        Behavior: Behavior normal.     ED Results / Procedures / Treatments   Labs (all labs ordered are listed, but only abnormal results are displayed) Labs Reviewed  COMPREHENSIVE METABOLIC PANEL - Abnormal; Notable for the following components:      Result Value   Glucose, Bld 117 (*)    All other components within normal limits  CBC WITH DIFFERENTIAL/PLATELET -  Abnormal; Notable for the following components:   Neutro Abs 1.6 (*)    All other components within normal limits    EKG EKG Interpretation  Date/Time:  Tuesday November 18 2019 13:12:17 EDT Ventricular Rate:  59 PR Interval:    QRS Duration: 91 QT Interval:  359 QTC Calculation: 356 R Axis:   73 Text Interpretation: Sinus rhythm LVH by voltage No significant change since last tracing Confirmed by Gwyneth Sprout ([id number]) on 11/18/2019 1:37:42 PM   Radiology No results found.  Procedures Procedures (including critical care time)  Medications Ordered in ED Medications  levETIRAcetam (KEPPRA) tablet 500 mg (has no administration in time range)    ED Course  I have reviewed the triage vital signs and the nursing notes.  Pertinent labs & imaging results that were available during my care of the patient were reviewed by me and considered in my medical decision making (see chart for details).    MDM Rules/Calculators/A&P                          Patient is a 19 year old male with past medical history of seizures and pseudoseizures presented today with seizure-like activity.  Per EMS patient was responsive to sternal rub during seizure-like activity and had questionable/no postictal period.  Patient has reassuring physical exam.  No evidence of trauma.  He seems to be mentating at his baseline per my understanding this patient from taking care of him 6 days ago.  I discussed this case with my attending physician who cosigned this note including patient's presenting symptoms, physical exam, and planned diagnostics and interventions. Attending physician stated agreement with plan or made changes to plan which were implemented.   Discussed this case with the DSS social worker in charge of this patient.  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  Patient is ambulatory and able to answer questions and follow commands.  Patient's CMP is without acute electrolyte abnormality.  CBC without leukocytosis or anemia.  EKG is without any evidence of ischemia.  Will provide patient with 500 mg tablet of Keppra and discharged home.  His Keppra levels from 6 days ago were slightly low but his valproic acid levels were within normal limits/therapeutic.  Again very high suspicion for malingering.  Patient may be having behavioral outburst secondary to not enjoying his living environment or ministries.  2:24 PM patient has been monitored for 2 hours and continues to be well-appearing and I am not concerned for ongoing seizures.  He has been given a additional dose of Keppra here.  Will discharge with close follow-up recommendations with PCP/neurologist/psychiatrist.  Final Clinical Impression(s) / ED Diagnoses Final diagnoses:  Seizure-like activity [facility])    Rx / DC Orders ED Discharge Orders    None       Gailen Shelter, [place] 11/18/19 1424    Gwyneth Sprout, MD 11/19/19 1015

## 2019-11-18 NOTE — ED Notes (Signed)
HIPPA compliant voicemail left for guardian regarding pts discharge status.

## 2019-11-18 NOTE — Discharge Instructions (Addendum)
Please continue take your Keppra and valproic acid as prescribed.  Please follow-up with your neurologist.

## 2019-11-19 ENCOUNTER — Emergency Department (HOSPITAL_COMMUNITY)
Admission: EM | Admit: 2019-11-19 | Discharge: 2019-12-12 | Disposition: A | Discharge status: Other Acute Inpatient Hospital | Payer: Medicaid Other | Attending: Emergency Medicine | Admitting: Emergency Medicine

## 2019-11-19 ENCOUNTER — Encounter (HOSPITAL_COMMUNITY): Payer: Self-pay | Admitting: Emergency Medicine

## 2019-11-19 DIAGNOSIS — F840 Autistic disorder: Secondary | ICD-10-CM | POA: Diagnosis not present

## 2019-11-19 DIAGNOSIS — Z880 Allergy status to penicillin: Secondary | ICD-10-CM | POA: Diagnosis not present

## 2019-11-19 DIAGNOSIS — Z7722 Contact with and (suspected) exposure to environmental tobacco smoke (acute) (chronic): Secondary | ICD-10-CM | POA: Insufficient documentation

## 2019-11-19 DIAGNOSIS — Z20822 Contact with and (suspected) exposure to covid-19: Secondary | ICD-10-CM | POA: Insufficient documentation

## 2019-11-19 DIAGNOSIS — R569 Unspecified convulsions: Secondary | ICD-10-CM | POA: Diagnosis present

## 2019-11-19 DIAGNOSIS — F69 Unspecified disorder of adult personality and behavior: Secondary | ICD-10-CM

## 2019-11-19 DIAGNOSIS — Z5900 Homelessness unspecified: Secondary | ICD-10-CM

## 2019-11-19 DIAGNOSIS — E039 Hypothyroidism, unspecified: Secondary | ICD-10-CM | POA: Diagnosis not present

## 2019-11-19 LAB — COMPREHENSIVE METABOLIC PANEL
ALT: 14 U/L (ref 0–44)
AST: 23 U/L (ref 15–41)
Albumin: 4.1 g/dL (ref 3.5–5.0)
Alkaline Phosphatase: 64 U/L (ref 38–126)
Anion gap: 9 (ref 5–15)
BUN: 14 mg/dL (ref 6–20)
CO2: 28 mmol/L (ref 22–32)
Calcium: 9.4 mg/dL (ref 8.9–10.3)
Chloride: 103 mmol/L (ref 98–111)
Creatinine, Ser: 1 mg/dL (ref 0.61–1.24)
GFR calc Af Amer: >60 mL/min (ref >60)
GFR calc non Af Amer: >60 mL/min (ref >60)
Glucose, Bld: 86 mg/dL (ref 70–99)
Potassium: 4.6 mmol/L (ref 3.5–5.1)
Sodium: 140 mmol/L (ref 135–145)
Total Bilirubin: 0.5 mg/dL (ref 0.3–1.2)
Total Protein: 8.3 g/dL — ABNORMAL HIGH (ref 6.5–8.1)

## 2019-11-19 LAB — CBC WITH DIFFERENTIAL/PLATELET
Abs Immature Granulocytes: 0.01 10*3/uL (ref 0.00–0.07)
Basophils Absolute: 0 10*3/uL (ref 0.0–0.1)
Basophils Relative: 0 %
Eosinophils Absolute: 0.2 10*3/uL (ref 0.0–0.5)
Eosinophils Relative: 5 %
HCT: 47.4 % (ref 39.0–52.0)
Hemoglobin: 15.5 g/dL (ref 13.0–17.0)
Immature Granulocytes: 0 %
Lymphocytes Relative: 42 %
Lymphs Abs: 1.9 10*3/uL (ref 0.7–4.0)
MCH: 30 pg (ref 26.0–34.0)
MCHC: 32.7 g/dL (ref 30.0–36.0)
MCV: 91.7 fL (ref 80.0–100.0)
Monocytes Absolute: 0.6 10*3/uL (ref 0.1–1.0)
Monocytes Relative: 13 %
Neutro Abs: 1.9 10*3/uL (ref 1.7–7.7)
Neutrophils Relative %: 40 %
Platelets: 171 10*3/uL (ref 150–400)
RBC: 5.17 MIL/uL (ref 4.22–5.81)
RDW: 12.8 % (ref 11.5–15.5)
WBC: 4.7 10*3/uL (ref 4.0–10.5)
nRBC: 0 % (ref 0.0–0.2)

## 2019-11-19 LAB — MAGNESIUM: Magnesium: 1.8 mg/dL (ref 1.7–2.4)

## 2019-11-19 LAB — SARS CORONAVIRUS 2 BY RT PCR (HOSPITAL ORDER, PERFORMED IN ~~LOC~~ HOSPITAL LAB): SARS Coronavirus 2: NEGATIVE

## 2019-11-19 LAB — ETHANOL: Alcohol, Ethyl (B): <10 mg/dL (ref <10)

## 2019-11-19 LAB — ACETAMINOPHEN LEVEL: Acetaminophen (Tylenol), Serum: <10 ug/mL — ABNORMAL LOW (ref 10–30)

## 2019-11-19 LAB — VALPROIC ACID LEVEL: Valproic Acid Lvl: 106 ug/mL — ABNORMAL HIGH (ref 50.0–100.0)

## 2019-11-19 LAB — SALICYLATE LEVEL: Salicylate Lvl: <7 mg/dL — ABNORMAL LOW (ref 7.0–30.0)

## 2019-11-19 MED ORDER — ZIPRASIDONE MESYLATE 20 MG IM SOLR
10.0000 mg | Freq: Once | INTRAMUSCULAR | Status: AC
  Administered 2019-11-19: 10 mg via INTRAMUSCULAR
  Filled 2019-11-19: qty 20

## 2019-11-19 NOTE — ED Provider Notes (Signed)
Signed out to me by PA Margarette Asal.  Refer to provider note for full history and physical examination.  Briefly, patient is a 19 year old male with history of intellectual disability, autism spectrum disorder presenting for assistance in finding long-term living facility.  [facility] is [facility] longer allowed at the [facility] due to behavioral outbursts.  Awaiting screening labs.  Case management is attempting to arrange for placement at a long-term facility in [place].  If patient attempts to leave he will require IVC.     MDM   Labs Reviewed  COMPREHENSIVE METABOLIC PANEL - Abnormal; Notable for the following components:      Result Value   Total Protein 8.3 (*)    All other components within normal limits  VALPROIC ACID LEVEL - Abnormal; Notable for the following components:   Valproic Acid Lvl 106 (*)    All other components within normal limits  SALICYLATE LEVEL - Abnormal; Notable for the following components:   Salicylate Lvl <7.0 (*)    All other components within normal limits  ACETAMINOPHEN LEVEL - Abnormal; Notable for the following components:   Acetaminophen (Tylenol), Serum <10 (*)    All other components within normal limits  SARS CORONAVIRUS 2 BY RT PCR (HOSPITAL ORDER, PERFORMED IN [facility])  CBC WITH DIFFERENTIAL/PLATELET  MAGNESIUM  ETHANOL  RAPID URINE DRUG SCREEN, HOSP PERFORMED    Screening labs reviewed and interpreted by myself mostly reassuring, valproic acid level very mildly supratherapeutic.  He is medically cleared for TTS evaluation at this time.  He attempted to leave the department so required IVC.  Appreciate social work's involvement in this patient's case.     Jeanie Sewer, PA-C 11/19/19 1727    Maia Plan, MD 11/20/19 2037

## 2019-11-19 NOTE — ED Triage Notes (Signed)
[place] via [place] with complaints of seizures.

## 2019-11-19 NOTE — ED Notes (Signed)
Patient changed into burgundy scrubs and belongings collected with security. Belongings (1 bag) including cell phone and charger stored in [place] bin.

## 2019-11-19 NOTE — BH Assessment (Signed)
Comprehensive Clinical Assessment (CCA) Note  11/19/2019 [name] [id number]  Visit Diagnosis:      ICD-10-CM   1. Behavior concern in adult  F69   2. Convulsions, unspecified convulsion type (HCC)  R56.9   3. Homelessness  Z59.0       CCA Screening, Triage and Referral (STR)  Patient Reported Information How did you hear about [place]? Other (Comment)  Referral name: Archivist  Referral phone number: No data recorded  Whom do you see for routine medical problems? [name]  Practice/Facility Name: No data recorded Practice/Facility Phone Number: No data recorded Name of Contact: No data recorded Contact Number: No data recorded Contact Fax Number: No data recorded Prescriber Name: No data recorded Prescriber Address (if known): No data recorded  What Is the Reason for Your Visit/Call Today? 19 y.o. male with past medical history of hypothyroidism, intellectual disability, homelessness, seizure disorder brought to the [name] from [name] for assistance in finding long-term living facility.  Patient is no longer allowed back at [name] due to behavioral outbursts.  How Long Has This Been Causing You Problems? > than 6 months  What Do You Feel Would Help You the Most Today? Other (Comment) (Pt does not have insight into needs)   Have You Recently Been in Any Inpatient Treatment ([name]/Detox/Crisis Center/28-Day Program)? No  Name/Location of Program/[name]:No data recorded How Long Were You There? No data recorded When Were You Discharged? No data recorded  Have You Ever Received Services From [name] Before? Yes  Who Do You See at [name]? Pt has repeat visits to [name] due to pseudoseizures (per report)   Have You Recently Had Any Thoughts About Hurting Yourself? Yes (''I feel like it now'')  Are You Planning to Commit Suicide/Harm Yourself At This time? Yes (''I feel like it now'')   Have you Recently Had Thoughts About Hurting  Someone [name]? No  Explanation: ''I feel like it now''   Have You Used Any Alcohol or Drugs in the Past 24 Hours? No  How Long Ago Did You Use Drugs or Alcohol? No data recorded What Did You Use and How Much? No data recorded  Do You Currently Have a Therapist/Psychiatrist? No  Name of Therapist/Psychiatrist: No data recorded  Have You Been Recently Discharged From Any Office Practice or Programs? Yes  Explanation of Discharge From Practice/Program: Pt was removed from [name] today due to outbursts     CCA Screening Triage Referral Assessment Type of Contact: Tele-Assessment  Is this Initial or Reassessment? Initial Assessment  Date Telepsych consult ordered in [facility]:  11/19/19  Time Telepsych consult ordered in [facility]:  No data recorded  Patient Reported Information Reviewed? No data recorded Patient Left Without Being Seen? No data recorded Reason for Not Completing Assessment: No data recorded  Collateral Involvement: No data recorded  Does Patient Have a Court Appointed Legal Guardian? No data recorded Name and Contact of Legal Guardian: [name] [phone]  If Minor and Not Living with Parent(s), Who has Custody? [name]  Is CPS involved or ever been involved? Never  Is APS involved or ever been involved? Currently   Patient Determined To Be At Risk for Harm To Self or Others Based on Review of Patient Reported Information or Presenting Complaint? No  Method: No data recorded Availability of Means: No data recorded Intent: No data recorded Notification Required: No data recorded Additional Information for Danger to Others Potential: No data recorded Additional Comments for Danger to  Others Potential: No data recorded Are There Guns or Other Weapons in [place]? No data recorded Types of Guns/Weapons: No data recorded Are These Weapons Safely Secured?                            No data recorded Who Could Verify You Are Able To Have These Secured:  No data recorded Do You Have any Outstanding Charges, Pending Court Dates, Parole/Probation? No data recorded Contacted To Inform of Risk of Harm To Self or Others: No data recorded  Location of Assessment: [name]   Does Patient Present under Involuntary Commitment? No  IVC Papers Initial File Date: No data recorded  [place] of Residence: [place]   Patient Currently Receiving the Following Services: No data recorded  Determination of Need: Urgent (48 hours)   Options For Referral: Other: Comment     CCA Biopsychosocial  Intake/Chief Complaint:   Pt is a 19 year old male who was referred from [name] due to seizures, and also because, per [name] report, he is no longer allowed at [name] due to aggressive outbursts there.  Pt's legal guardian is David ([name] rep David [name]).    Per David [name], Case Manager, she received a call today from [name] [phone]) stating that Pt is to be transferred to [name] in [place], [place], and she requests that Pt remain in the [name] until he can be transferred.   Per provider note, Pt has a history of hypothyroidism, I/DD, homelessness, seizure disorder and needs assistance in finding long-term living care.  Per provider, Pt is no longer allowed at [facility] due to behavioral outbursts.    A TTS consult was made.  Author assessed Pt. Pt was in street clothes and slumped over the [name] bed, with head and face against the bed.  Pt was mostly nonverbal.  He nodded when asked about suicidal ideation -- ''I want to hurt myself now'' and he stated that he wanted to choke himself.  He denied past attempts.  Pt also endorsed a desire to harm others now.  Pt stated also that he punches himself when upset.  He denied hallucination and substance use concerns.  Pt endorsed adequate sleep and appetite.  Mental Health Symptoms Depression:   Depression: Irritability  Mania:  Mania: Irritability  Anxiety:   Anxiety: None  Psychosis:  Psychosis: None  Trauma:  Trauma: Irritability/anger  Obsessions:  Obsessions: None  Compulsions:  Compulsions: None  Inattention:  Inattention: None  Hyperactivity/Impulsivity:  Hyperactivity/Impulsivity: N/A  Oppositional/Defiant Behaviors:  Oppositional/Defiant Behaviors: Aggression towards people/animals  Emotional Irregularity:  Emotional Irregularity: Mood lability  Other Mood/Personality Symptoms:  Other Mood/Personality Symptoms: Aggressive outbursts at [place] Status Exam Appearance and self-care  Stature:  Stature: Average  Weight:  Weight: Average weight  Clothing:  Clothing: Casual  Grooming:  Grooming: Normal  Cosmetic use:  Cosmetic Use: None  Posture/gait:  Posture/Gait: Slumped  Motor activity:  Motor Activity: Not Remarkable  Sensorium  Attention:  Attention: Inattentive  Concentration:  Concentration: Scattered  Orientation:  Orientation: Object, Person, Place  Recall/memory:  Recall/Memory: Normal  Affect and Mood  Affect:  Affect: Flat  Mood:  Mood: Irritable, Dysphoric  Relating  Eye contact:  Eye Contact: Avoided  Facial expression:  Facial Expression: Angry  Attitude toward examiner:  Attitude Toward Examiner: Uninterested  Thought and Language  Speech flow: Speech  Flow: Paucity, Slow  Thought content:  Thought Content: Appropriate to Mood and Circumstances  Preoccupation:  Preoccupations: None  Hallucinations:  Hallucinations: None  Organization:     Company secretary of Knowledge:  Fund of Knowledge: Poor  Intelligence:  Intelligence: Needs investigation (Per report, Pt is I/DD)  Abstraction:  Abstraction: [place]crete  Judgement:  Judgement: Impaired  Reality Testing:  Reality Testing: Adequate  Insight:  Insight: Poor  Decision Making:  Decision Making: Only simple  Social Functioning  Social Maturity:  Social Maturity:  Irresponsible  Social Judgement:  Social Judgement: Heedless  Stress  Stressors:  Stressors: Other (Comment) ('Seizures')  Coping Ability:  Coping Ability: Exhausted  Skill Deficits:  Skill Deficits: Activities of daily living, Self-care, Self-control, Intellect/education  Supports:  Supports: Support needed     Religion:    Leisure/Recreation:    Exercise/Diet: Exercise/Diet Have You Gained or Lost A Significant Amount of Weight in the Past Six Months?: No Do You Follow a Special Diet?: No Do You Have Any Trouble Sleeping?: No   CCA Employment/Education  Employment/Work Situation: Employment / Work Psychologist, occupational Employment situation: On disability  Education: Education Is Patient Currently Attending School?: No   CCA Family/Childhood History  Family and Relationship History: Family history Marital status: Single Does patient have children?: No  Childhood History:     Child/Adolescent Assessment:     CCA Substance Use  Alcohol/Drug Use: Alcohol / Drug Use Pain Medications: See MAR Prescriptions: See MAR Over the Counter: See MAR History of alcohol / drug use?: No history of alcohol / drug abuse Longest period of sobriety (when/how long): none reported                         ASAM's:  Six Dimensions of Multidimensional Assessment  Dimension 1:  Acute Intoxication and/or Withdrawal Potential:      Dimension 2:  Biomedical Conditions and Complications:      Dimension 3:  Emotional, Behavioral, or Cognitive Conditions and Complications:     Dimension 4:  Readiness to Change:     Dimension 5:  Relapse, Continued use, or Continued Problem Potential:     Dimension 6:  Recovery/Living Environment:     ASAM Severity Score:    ASAM Recommended Level of Treatment:     Substance use Disorder (SUD)    Recommendations for Services/Supports/Treatments:    DSM5 Diagnoses: Patient Active Problem List   Diagnosis Date Noted  . Autism 10/26/2019   . Recurrent seizures (HCC) 10/23/2019  . 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 Seizure disorder (HCC) 04/24/2018  . Seizure (HCC) 04/24/2017  . Acne 11/03/2016  . Depression 11/03/2016  . Intellectual disability 11/03/2016  . 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 Hypothyroidism 04/02/2008  . RHINITIS, ALLERGIC 07/26/2006    Patient Centered Plan: Patient is on the following Treatment Plan(s):   Referrals to Alternative Service(s): Referred to Alternative Service(s):   Place:   Date:   Time:    Referred to Alternative Service(s):   Place:   Date:   Time:    Referred to Alternative Service(s):   Place:   Date:   Time:    Referred to Alternative Service(s):   Place:   Date:   Time:     MSE:   During assessment, Pt presented as guarded and uncooperative.  He was leaning against the bed  with his face against the mattress, and only turned to face Pt twice.  Pt was dressed in street clothes, and he appeared appropriately groomed.  Pt's demeanor was guarded and uninterested.  Pt's affect was irritated.  Mood was flat.  Pt's speech was soft and slow.  Thought processes were within normal range, and thought content was coherent.  There was no evidence of delusion.  Pt's insight, judgment, and impulse control were poor.  DISPOSITION: Consulted with S. Rankin, NP.  Pt's report of suicidal ideation and homicidal ideation are related to current irritability and frustration of being removed from [employer].  Per S. Rankin, NP, Pt is psych-cleared. [name]

## 2019-11-19 NOTE — ED Provider Notes (Signed)
[facility] Provider Note   CSN: [id number] Arrival date & time: 11/19/19  1121     History Chief Complaint  Patient presents with  . Seizures    David [name] is a 19 y.o. male with past medical history of hypothyroidism, intellectual disability, homelessness, seizure disorder brought to the ED from [facility] for assistance in finding long-term living facility.  Patient is no longer allowed back at [facility] due to behavioral outbursts.  Level 5 caveat due to intellectual disability.  Patient on the computer googling wrestling videos, laughing, redirectable when he tried to walk out of the room.  Alesia case management nurse is at bedside who provides history.  States patient has long history of behavioral issues and frequent ED visits for "pseudoseizures".  He has been to the ED several times lately for recurrent seizure-like activity at [facility].   Unfortunately, patient has no family to assist and he is a ward of the state.  Per Wilford Sports from [facility] Acute Care Coordinator has been notified patient has had several admission from the homeless shelter for seizures.  Victorino Dike in process of transferring and finding placement to [facility] in [place].  Per Cathlean Cower this hospital will be able to provide behavioral and medical care for patient.  CM and Victorino Dike requesting patient be held in hospital until they finalize paperwork and placement.    HPI     Past Medical History:  Diagnosis Date  . Autism   . Intellectual disability    Moderate  . Limbic encephalitis   . Seizures (HCC)   . Speech delay   . Thyroid disease     Patient Active Problem List   Diagnosis Date Noted  . Autism disorder 10/26/2019  . Recurrent seizures (HCC) 10/23/2019  . 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 Seizure disorder (HCC) 04/24/2018  . Seizure (HCC) 04/24/2017  . Acne 11/03/2016  .  Depression 11/03/2016  . Intellectual disability 11/03/2016  . 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 Hypothyroidism 04/02/2008  . RHINITIS, ALLERGIC 07/26/2006    Past Surgical History:  Procedure Laterality Date  . BRAIN SURGERY     Brain biopsy  . GASTROSTOMY TUBE REVISION         No family history on file.  Social History   Tobacco Use  . Smoking status: Passive Smoke Exposure - Never Smoker  . Smokeless tobacco: Never Used  Substance Use Topics  . Alcohol use: No  . Drug use: No    Home Medications Prior to Admission medications   Medication Sig Start Date End Date Taking? Authorizing Provider  cholecalciferol (VITAMIN D3) 25 MCG (1000 UNIT) tablet Take 1 tablet (1,000 Units total) by mouth daily. 10/28/19   Ghimire, Werner Lean, MD  divalproex (DEPAKOTE ER) 250 MG 24 hr tablet Take 5 tablets (1,250 mg total) by mouth 2 (two) times daily. 10/28/19 11/27/19  Ghimire, Werner Lean, MD  levETIRAcetam (KEPPRA) 500 MG tablet Take 1 tablet (500 mg total) by mouth 2 (two) times daily. 10/28/19 11/27/19  Ghimire, Werner Lean, MD  levothyroxine (SYNTHROID) 125 MCG tablet Take 0.5 tablets (62.5 mcg total) by mouth daily. 10/28/19 11/27/19  Ghimire, Werner Lean, MD  risperiDONE (RISPERDAL) 0.5 MG tablet Take 1 tablet (0.5 mg total) by mouth 2 (two) times daily. 10/28/19   Ghimire, Werner Lean, MD    Allergies  Penicillins  Review of Systems   Review of Systems  Unable to perform ROS: Other (autism)  All other systems reviewed and are negative.   Physical Exam Updated Vital Signs BP 128/70 (BP Location: Right Arm)   Pulse 88   Temp 98 F (36.7 C) (Oral)   Resp 19   SpO2 98%   Physical Exam Constitutional:      Appearance: He is well-developed.     Comments: No distress. Patient smiling, playful in room. Using computer googling wrestling videos.  Sliding on the rolling chair in room trying to sneak  out of the room but redirectable.  Denies pain.   HENT:     Head: Normocephalic.     Nose: Nose normal.  Eyes:     General: Lids are normal.  Cardiovascular:     Rate and Rhythm: Normal rate.  Pulmonary:     Effort: Pulmonary effort is normal. No respiratory distress.  Musculoskeletal:        General: Normal range of motion.     Cervical back: Normal range of motion.  Neurological:     Mental Status: He is alert.  Psychiatric:        Cognition and Memory: Cognition is impaired.     ED Results / Procedures / Treatments   Labs (all labs ordered are listed, but only abnormal results are displayed) Labs Reviewed  SARS CORONAVIRUS 2 BY RT PCR (HOSPITAL ORDER, [facility])  CBC WITH DIFFERENTIAL/PLATELET  COMPREHENSIVE METABOLIC PANEL  MAGNESIUM  VALPROIC ACID LEVEL  ETHANOL  SALICYLATE LEVEL  ACETAMINOPHEN LEVEL  RAPID URINE DRUG SCREEN, HOSP PERFORMED    EKG None  Radiology No results found.  Procedures Procedures (including critical care time)  Medications Ordered in ED Medications - No data to display  ED Course  I have reviewed the triage vital signs and the nursing notes.  Pertinent labs & imaging results that were available during my care of the patient were reviewed by me and considered in my medical decision making (see chart for details).    MDM Rules/Calculators/A&P                          19 year old male with history of autism, non epileptic seizures, homelessness, behavioral issues presents to the ED for assistance and finding placement for him at behavioral/medical hospital in [place].  Patient is a ward of the state and has DDS legal guardian Caren Griffins.   I obtained direct history from case management nurse Elmo Putt who is at bedside.  Apparently Anderson Malta (acute care coordinator) called Edwin Cap and notified her that patient has had several ED visits for recurrent seizures.   They are rying to find placement for patient at  behavioral/medical hospital in [place].   Patient does not have any family members or a home to be discharged to.  He is no longer allowed back at [facility].  We have been asked to keep patient here in the ED until placement is confirmed and finalized.  On exam, patient is in no distress, redirectable.  He does not appear to be able to make medical or legal decisions for himself.  I do not think he would be safe being discharged.  I do think that he would benefit from medical and psychiatric evaluation here in the ED to make sure he is stable in preparation of finalizing transfer/admission to behavioral/medical hospital in [place].  I reviewed patient's recent EMR to assist with  history.  He had a recent hospitalization with discharge on 6/1 for recurrent seizures, prolonged postictal state.  Had normal head CT.  Neurology evaluated the patient and followed him closely during that admission.  He had an EEG during that admission that did not show any epileptic activity and seizures were determined to be nonepileptic.  He was discharged on Keppra 500 mg twice and Depakote 250 mg daily.    1533: Will order medical screening labs.  There was no report of seizure activity prior to arrival or while in the ED.  TTS ordered. Meds ordered.  Patient will be handed off to oncoming EDPA who will follow up on labs.  CM on board and will fax paperwork to [facility].  Final Clinical Impression(s) / ED Diagnoses Final diagnoses:  Behavior concern in adult  Convulsions, unspecified convulsion type [facility])  Homelessness    Rx / DC Orders ED Discharge Orders    None       Jerrell Mylar 11/19/19 1535    Alvira Monday, MD 11/19/19 2043

## 2019-11-19 NOTE — ED Notes (Signed)
Per Tia Alert, department director, pt does not have to be changed into scrubs at this time. Pt will be wanded by security with sitter at bedside.

## 2019-11-19 NOTE — ED Notes (Signed)
Patient is resting comfortably. 

## 2019-11-19 NOTE — Progress Notes (Addendum)
Spoke to Arnetha Courser, DSS Legal Guardian and states pt is not safe to dc to streets as he is homeless at this time. [facility] will not accept pt back to the homeless shelter. Gave permission to IVC patient if he threatens to leave.   TOC CM spoke to Sherrill, Admission Coordinator with [facility] and [facility], [place], [place] # [phone], fax [phone]. States they will review progress notes and see if pt is appropriate for admissions. Faxed paperwork to [facility], provided contact number. Waiting call back. Isidoro Donning RN CCM, [facility] ED TOC CM [phone]

## 2019-11-19 NOTE — ED Notes (Signed)
Patient is resting comfortably. Audible respirations

## 2019-11-19 NOTE — ED Notes (Signed)
He chooses to sit and thence lie down on the floor rather than the bed. He is then sonvinced by our C.N. to get into the bed.

## 2019-11-19 NOTE — Progress Notes (Signed)
TOC CM received a call from [facility], [place], [phone], email [email or web address]. States she was made aware of multiple admissions from the [facility] for seizures. States she has put into place pt being transferred to [facility] in [place], they will assist pt with mental and medical needs. She reports the will need an MD statement and hospital medical records sent to hospital. Requesting pt remain in hospital until they can transfer him to [facility]. Will give information to ED provider. States she will contact [facility] to see how soon they can do an admission. Isidoro Donning RN CCM, [facility] ED TOC CM [phone]

## 2019-11-20 LAB — RAPID URINE DRUG SCREEN, HOSP PERFORMED
Amphetamines: NOT DETECTED
Barbiturates: NOT DETECTED
Benzodiazepines: NOT DETECTED
Cocaine: NOT DETECTED
Opiates: NOT DETECTED
Tetrahydrocannabinol: NOT DETECTED

## 2019-11-20 LAB — CBG MONITORING, ED: Glucose-Capillary: 99 mg/dL (ref 70–99)

## 2019-11-20 MED ORDER — LEVETIRACETAM 500 MG PO TABS
500.0000 mg | ORAL_TABLET | Freq: Two times a day (BID) | ORAL | Status: DC
  Administered 2019-11-20 – 2019-12-12 (×42): 500 mg via ORAL
  Filled 2019-11-20 (×43): qty 1

## 2019-11-20 MED ORDER — LEVOTHYROXINE SODIUM 50 MCG PO TABS
62.5000 ug | ORAL_TABLET | Freq: Every day | ORAL | Status: DC
  Administered 2019-11-20 – 2019-12-12 (×23): 62.5 ug via ORAL
  Filled 2019-11-20 (×26): qty 1

## 2019-11-20 MED ORDER — DIVALPROEX SODIUM ER 500 MG PO TB24
1250.0000 mg | ORAL_TABLET | Freq: Two times a day (BID) | ORAL | Status: DC
  Administered 2019-11-20 – 2019-12-12 (×43): 1250 mg via ORAL
  Filled 2019-11-20 (×35): qty 2
  Filled 2019-11-20: qty 1
  Filled 2019-11-20 (×6): qty 2

## 2019-11-20 MED ORDER — LORAZEPAM 2 MG/ML IJ SOLN
1.0000 mg | Freq: Once | INTRAMUSCULAR | Status: AC
  Administered 2019-11-20: 1 mg via INTRAMUSCULAR
  Filled 2019-11-20: qty 1

## 2019-11-20 MED ORDER — RISPERIDONE 0.5 MG PO TABS
0.5000 mg | ORAL_TABLET | Freq: Two times a day (BID) | ORAL | Status: DC
  Administered 2019-11-20 – 2019-12-12 (×43): 0.5 mg via ORAL
  Filled 2019-11-20 (×44): qty 1

## 2019-11-20 NOTE — Progress Notes (Signed)
11/20/2019 1759  Notified MD that patient is experiencing another episode of a possible seizure. Vital signs are within normal limits. Requesting that patient be moved to main ED to be monitored closer.

## 2019-11-20 NOTE — ED Notes (Signed)
Patient is resting comfortably.NO distress noted

## 2019-11-20 NOTE — ED Notes (Signed)
Pt escorted from [facility] to [facility] rm 31, pt cooperative at this time. One pt belongings bag transferred to locker 31 in [place].

## 2019-11-20 NOTE — ED Notes (Signed)
Patient is resting comfortably. 

## 2019-11-20 NOTE — ED Notes (Signed)
Seizure pad installed on bed rails for precaution

## 2019-11-20 NOTE — ED Notes (Signed)
Pt alert, calm at this time. Sitter at bedside, pt eating breakfast.

## 2019-11-20 NOTE — ED Notes (Signed)
Patient spoke with former caregiver Santiago Bumpers on the phone.

## 2019-11-20 NOTE — NC FL2 (Signed)
[place] LEVEL OF CARE SCREENING TOOL     IDENTIFICATION  Patient Name: David [name] Birthdate: 2000/07/22 Sex: male Admission Date (Current Location): 11/19/2019  [place] and [place] Number:  Kathleen Argue [id number] [place] and Address:  [facility],  [place] [address] [place]      Provider Number: [id number]  Attending Physician Name and Address:  Default, Provider, MD  Relative Name and Phone Number:       Current Level of Care: Hospital Recommended Level of Care: Other (Comment) (Group Home) Prior Approval Number:    Date Approved/Denied:   PASRR Number:    Discharge Plan: Other (Comment) (Group Home)    Current Diagnoses: Patient Active Problem List   Diagnosis Date Noted  . Autism 10/26/2019  . Recurrent seizures ([place]) 10/23/2019  . 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 Seizure disorder ([place]) 04/24/2018  . Seizure ([place]) 04/24/2017  . Acne 11/03/2016  . Depression 11/03/2016  . Intellectual disability 11/03/2016  . 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 Epilepsy ([place]) 11/03/2016  . 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 Hypothyroidism 04/02/2008  . RHINITIS, ALLERGIC 07/26/2006    Orientation RESPIRATION BLADDER Height & Weight     Self, Time, Situation, Place  Normal Continent Weight:   Height:     BEHAVIORAL SYMPTOMS/MOOD NEUROLOGICAL BOWEL NUTRITION STATUS    Convulsions/Seizures (HX of pseudo-seizures) Continent Diet (Regular)  AMBULATORY STATUS COMMUNICATION OF NEEDS Skin   Independent Verbally Normal                       Personal Care Assistance Level of Assistance              Functional Limitations Info             SPECIAL CARE FACTORS FREQUENCY                       Contractures Contractures Info: Not present    Additional Factors Info  Code Status, Allergies Code Status Info: FULLCODE Allergies Info:  Penicillins           Current Medications (11/20/2019):  This is the current hospital active medication list Current Facility-Administered Medications  Medication Dose Route Frequency Provider Last Rate Last Admin  . divalproex (DEPAKOTE ER) 24 hr tablet 1,250 mg  1,250 mg Oral BID Virgel Manifold, MD      . levETIRAcetam (KEPPRA) tablet 500 mg  500 mg Oral BID Virgel Manifold, MD      . levothyroxine (SYNTHROID) tablet 62.5 mcg  62.5 mcg Oral Daily Virgel Manifold, MD   62.5 mcg at 11/20/19 1646  . risperiDONE (RISPERDAL) tablet 0.5 mg  0.5 mg Oral BID Virgel Manifold, MD       Current Outpatient Medications  Medication Sig Dispense Refill  . cholecalciferol (VITAMIN D3) 25 MCG (1000 UNIT) tablet Take 1 tablet (1,000 Units total) by mouth daily. 30 tablet 0  . divalproex (DEPAKOTE ER) 250 MG 24 hr tablet Take 5 tablets (1,250 mg total) by mouth 2 (two) times daily. 300 tablet 0  . levETIRAcetam (KEPPRA) 500 MG tablet Take 1 tablet (500 mg total) by mouth 2 (two) times daily. 60 tablet 0  . levothyroxine (SYNTHROID) 125 MCG tablet Take 0.5 tablets (62.5 mcg total) by mouth daily. 15 tablet 0  . risperiDONE (RISPERDAL) 0.5 MG tablet Take 1 tablet (0.5 mg total) by mouth 2 (two) times daily. [place]  tablet 0     Discharge Medications: Please see discharge summary for a list of discharge medications.  Relevant Imaging Results:  Relevant Lab Results:   Additional Information [id number]  [name] Maley Venezia, LCSW

## 2019-11-20 NOTE — Progress Notes (Signed)
11/20/2019  1405  Patient has been in his room calem and cooperative today. Patient has been watching TV. Meal tray provided and patient eat 90%.

## 2019-11-20 NOTE — Progress Notes (Signed)
11/20/2019  1625  Notified MD of possible seizure activity. MD at bedside.

## 2019-11-20 NOTE — ED Provider Notes (Signed)
4:32 PM Called to bedside by nursing for possible seizure. By the time I arrived he was laying in the bed. Quiet and not talking but would follow commands like squeezing my fingers and give me a thumbs up. A about a minute later his eyes began fluttering and he turned his head to the the left. Extended R arm and mild rhythmic shaking of L arm. He kept legs crossed w/o discernable movement. Not responding for about 15 seconds. Immediately after this activity stopped he was again following commands. I doubt truly seizure although he does have a listed history. Home meds already ordered. Will check glucose and continue to monitor.    Raeford Razor, MD 11/20/19 [phone]

## 2019-11-20 NOTE — Progress Notes (Addendum)
11/20/2019 119 pm TOC CM did contact Desiree, Admissions Coordinator at [facility]. States they did receive necessary paperwork. She will have Medical Advisor review the letter of Medical Necessity to bill insurance. She will let me know if note will be sufficient. States pt will be presented to the clinical team for review and pending insurance approval. Will need daily documentation on patient behaviors. They will not be able to accept if pt is aggressive as he will be in area with other children and adolescents patients. Updated ED RN for documentation. Isidoro Donning RN CCM, [facility] ED TOC CM [phone]  11/19/2019  Spoke to Arnetha Courser, DSS Legal [phone] and states pt is not safe to dc to streets as he is homeless at this time. [facility] will not accept pt back to the homeless shelter. Gave permission to IVC patient if he threatens to leave.   TOC CM spoke to [place], Admission Coordinator with [facility] and [facility], [place], [place] # [phone], fax [phone]. States they will review progress notes and see if pt is appropriate for admissions. Faxed paperwork to [facility], provided contact number. Waiting call back. Isidoro Donning RN CCM, [facility] ED TOC CM [phone]   TOC CM received a call from [facility], [place], [phone], email [email or web address]. States she was made aware of multiple admissions from the [facility] for seizures. States she has put into place pt being transferred to [facility] in [place], they will assist pt with mental and medical needs. She reports the will need an MD statement and hospital medical records sent to hospital. Requesting pt remain in hospital until they can transfer him to [facility]. Will give information to ED provider. States she will contact [facility] to see how soon they can do an  admission. Isidoro Donning RN CCM, [facility] ED TOC CM [phone]

## 2019-11-20 NOTE — ED Notes (Signed)
Diet requested for patient

## 2019-11-21 NOTE — NC FL2 (Signed)
[place] LEVEL OF CARE SCREENING TOOL     IDENTIFICATION  Patient Name: David [name] Birthdate: March 18, 2001 Sex: male Admission Date (Current Location): 11/19/2019  [place] and [place] Number:  Kathleen Argue [id number] [place] and Address:  [facility] [place],  [place] [address] [place]      Provider Number: [id number]  Attending Physician Name and Address:  Default, Provider, MD  Relative Name and Phone Number:  Timoteo Gaul DSS Legal Guardian # [phone], [phone]    Current Level of Care: Hospital Recommended Level of Care: Other (Comment) (Group Home) Prior Approval Number:    Date Approved/Denied:   PASRR Number:    Discharge Plan: Other (Comment) (Group Home)    Current Diagnoses: Patient Active Problem List   Diagnosis Date Noted  . Autism 10/26/2019  . Recurrent seizures ([place]) 10/23/2019  . 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 Seizure disorder ([place]) 04/24/2018  . Seizure ([place]) 04/24/2017  . Acne 11/03/2016  . Depression 11/03/2016  . Intellectual disability 11/03/2016  . 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 Epilepsy ([place]) 11/03/2016  . 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 Hypothyroidism 04/02/2008  . RHINITIS, ALLERGIC 07/26/2006    Orientation RESPIRATION BLADDER Height & Weight     Self, Time, Situation, Place  Normal Continent Weight:   Height:     BEHAVIORAL SYMPTOMS/MOOD NEUROLOGICAL BOWEL NUTRITION STATUS    Convulsions/Seizures (HX of pseudo-seizures) Continent Diet (Regular)  AMBULATORY STATUS COMMUNICATION OF NEEDS Skin   Independent Verbally Normal                       Personal Care Assistance Level of Assistance  Bathing, Dressing, Feeding Bathing Assistance: Independent Feeding assistance: Independent Dressing Assistance: Independent     Functional Limitations Info  Sight, Speech, Hearing Sight Info: Adequate Hearing  Info: Adequate Speech Info: Adequate    SPECIAL CARE FACTORS FREQUENCY                       Contractures Contractures Info: Not present    Additional Factors Info  Code Status, Allergies Code Status Info: FULLCODE Allergies Info: Penicillins           Current Medications (11/21/2019):  This is the current hospital active medication list Current Facility-Administered Medications  Medication Dose Route Frequency Provider Last Rate Last Admin  . divalproex (DEPAKOTE ER) 24 hr tablet 1,250 mg  1,250 mg Oral BID Virgel Manifold, MD   1,250 mg at 11/21/19 0941  . levETIRAcetam (KEPPRA) tablet 500 mg  500 mg Oral BID Virgel Manifold, MD   500 mg at 11/21/19 0944  . levothyroxine (SYNTHROID) tablet 62.5 mcg  62.5 mcg Oral Daily Virgel Manifold, MD   62.5 mcg at 11/21/19 0941  . risperiDONE (RISPERDAL) tablet 0.5 mg  0.5 mg Oral BID Virgel Manifold, MD   0.5 mg at 11/21/19 4496   Current Outpatient Medications  Medication Sig Dispense Refill  . cholecalciferol (VITAMIN D3) 25 MCG (1000 UNIT) tablet Take 1 tablet (1,000 Units total) by mouth daily. 30 tablet 0  . divalproex (DEPAKOTE ER) 250 MG 24 hr tablet Take 5 tablets (1,250 mg total) by mouth 2 (two) times daily. 300 tablet 0  . levETIRAcetam (KEPPRA) 500 MG tablet Take 1 tablet (500 mg total) by mouth 2 (two) times daily. 60 tablet 0  . levothyroxine (SYNTHROID) 125 MCG tablet Take 0.5 tablets (62.5 mcg total) by mouth daily.  15 tablet 0  . risperiDONE (RISPERDAL) 0.5 MG tablet Take 1 tablet (0.5 mg total) by mouth 2 (two) times daily. 60 tablet 0     Discharge Medications: Please see discharge summary for a list of discharge medications.  Relevant Imaging Results:  Relevant Lab Results:   Additional Information [id number]  Elliot Cousin, RN

## 2019-11-21 NOTE — Social Work (Signed)
TOC CSW met with David [name], DSS Legal Guardian by pts bedside.  The plan of care is [facility] or group home which facility accepts him first.  Legal Guardian states that she has been working diligently to locate a group home to accept patient.  They have secured financing for group home.  Legal Guardian reports she needs an FL2, Covid-19 test, and TB test.  CSW faxed FL2 to Legal Guardian at [phone].  Will wait closer to dc before testing for COVID and TB.   Lititia Sen Tarpley-Carter,MSW, LCSW-A, [facility] ED TOC CSW  [phone]

## 2019-11-21 NOTE — ED Notes (Signed)
Pt alert. Pt cooperative and calm this shift. Pt non verbal. .  Pt redirectable.  Pt medication compliant. Pt needs prompting with ADL. Pt ambulatory.

## 2019-11-21 NOTE — NC FL2 (Deleted)
[place] LEVEL OF CARE SCREENING TOOL     IDENTIFICATION  Patient Name: David [name] Birthdate: 03/06/2001 Sex: male Admission Date (Current Location): 11/19/2019  [place] and [place] Number:  Kathleen Argue [id number] [place] and Address:  [facility],  [place] [address] [place]      Provider Number: [id number]  Attending Physician Name and Address:  Default, Provider, MD  Relative Name and Phone Number:  Timoteo Gaul DSS Legal Guardian # [phone], [phone]    Current Level of Care: Hospital Recommended Level of Care: Other (Comment) (Group Home) Prior Approval Number:    Date Approved/Denied:   PASRR Number:    Discharge Plan: Other (Comment) (Group Home)    Current Diagnoses: Patient Active Problem List   Diagnosis Date Noted  . Autism 10/26/2019  . Recurrent seizures ([place]) 10/23/2019  . 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 Seizure disorder ([place]) 04/24/2018  . Seizure ([place]) 04/24/2017  . Acne 11/03/2016  . Depression 11/03/2016  . Intellectual disability 11/03/2016  . 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 Epilepsy ([place]) 11/03/2016  . 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 Hypothyroidism 04/02/2008  . RHINITIS, ALLERGIC 07/26/2006    Orientation RESPIRATION BLADDER Height & Weight     Self, Time, Situation, Place  Normal Continent Weight: 5'4" Height:  83 Kg  BEHAVIORAL SYMPTOMS/MOOD NEUROLOGICAL BOWEL NUTRITION STATUS    Convulsions/Seizures (HX of pseudo-seizures) Continent Diet (Regular)  AMBULATORY STATUS COMMUNICATION OF NEEDS Skin   Independent Verbally Normal                       Personal Care Assistance Level of Assistance  Bathing, Dressing, Feeding Bathing Assistance: Independent Feeding assistance: Independent Dressing Assistance: Independent     Functional Limitations Info  Sight, Speech, Hearing Sight Info:  Adequate Hearing Info: Adequate Speech Info: Adequate    SPECIAL CARE FACTORS FREQUENCY                       Contractures Contractures Info: Not present    Additional Factors Info  Code Status, Allergies Code Status Info: FULLCODE Allergies Info: Penicillins           Current Medications (11/21/2019):  This is the current hospital active medication list Current Facility-Administered Medications  Medication Dose Route Frequency Provider Last Rate Last Admin  . divalproex (DEPAKOTE ER) 24 hr tablet 1,250 mg  1,250 mg Oral BID Virgel Manifold, MD   1,250 mg at 11/21/19 0941  . levETIRAcetam (KEPPRA) tablet 500 mg  500 mg Oral BID Virgel Manifold, MD   500 mg at 11/21/19 0944  . levothyroxine (SYNTHROID) tablet 62.5 mcg  62.5 mcg Oral Daily Virgel Manifold, MD   62.5 mcg at 11/21/19 0941  . risperiDONE (RISPERDAL) tablet 0.5 mg  0.5 mg Oral BID Virgel Manifold, MD   0.5 mg at 11/21/19 6767   Current Outpatient Medications  Medication Sig Dispense Refill  . cholecalciferol (VITAMIN D3) 25 MCG (1000 UNIT) tablet Take 1 tablet (1,000 Units total) by mouth daily. 30 tablet 0  . divalproex (DEPAKOTE ER) 250 MG 24 hr tablet Take 5 tablets (1,250 mg total) by mouth 2 (two) times daily. 300 tablet 0  . levETIRAcetam (KEPPRA) 500 MG tablet Take 1 tablet (500 mg total) by mouth 2 (two) times daily. 60 tablet 0  . levothyroxine (SYNTHROID) 125 MCG tablet Take 0.5 tablets (62.5 mcg total) by mouth daily. 15  tablet 0  . risperiDONE (RISPERDAL) 0.5 MG tablet Take 1 tablet (0.5 mg total) by mouth 2 (two) times daily. 60 tablet 0     Discharge Medications: Please see discharge summary for a list of discharge medications.  Relevant Imaging Results:  Relevant Lab Results:   Additional Information [id number]  Elliot Cousin, RN

## 2019-11-22 NOTE — ED Notes (Signed)
Pt alert this shift. Pt confused. Pt calm, cooperative. Pt confused, mostly nonverbal. Pt able to ambulate. Pt needs prompting and direction with ADLs

## 2019-11-22 NOTE — ED Provider Notes (Signed)
He is resting comfortably at this time.  Nursing does not report any persistent problems.  He is awaiting placement and remains under involuntary commitment.   Mancel Bale, MD 11/22/19 [phone]

## 2019-11-23 MED ORDER — LORAZEPAM 2 MG/ML IJ SOLN
1.0000 mg | Freq: Once | INTRAMUSCULAR | Status: AC
  Administered 2019-11-23: 1 mg via INTRAMUSCULAR
  Filled 2019-11-23: qty 1

## 2019-11-23 NOTE — ED Notes (Signed)
Pt is has decided to eat his lunch, I also order him cheeseburger fries with cake for dinner which is what you wants to eat.

## 2019-11-23 NOTE — ED Notes (Signed)
Seizure activity noted and Dr. Juleen China notified.  Patient is safe with seizure pads in place.

## 2019-11-23 NOTE — ED Notes (Signed)
Pt eat all of his dinner.

## 2019-11-23 NOTE — ED Notes (Signed)
Patient is pleasant and cooperative.  He is compliant with his meds.  He has a bright affect and he is staying in his room watching tv.  Sitter is at doorway for safety.

## 2019-11-24 ENCOUNTER — Inpatient Hospital Stay (INDEPENDENT_AMBULATORY_CARE_PROVIDER_SITE_OTHER): Payer: Medicaid Other | Admitting: Primary Care

## 2019-11-24 MED ORDER — LORAZEPAM 2 MG/ML IJ SOLN
1.0000 mg | Freq: Once | INTRAMUSCULAR | Status: AC
  Administered 2019-11-24: 1 mg via INTRAMUSCULAR
  Filled 2019-11-24: qty 1

## 2019-11-24 MED ORDER — DIPHENHYDRAMINE HCL 50 MG/ML IJ SOLN
50.0000 mg | Freq: Once | INTRAMUSCULAR | Status: AC
  Administered 2019-11-24: 50 mg via INTRAMUSCULAR
  Filled 2019-11-24: qty 1

## 2019-11-24 MED ORDER — STERILE WATER FOR INJECTION IJ SOLN
INTRAMUSCULAR | Status: AC
  Filled 2019-11-24: qty 10

## 2019-11-24 MED ORDER — ZIPRASIDONE MESYLATE 20 MG IM SOLR
10.0000 mg | Freq: Once | INTRAMUSCULAR | Status: AC
  Administered 2019-11-24: 10 mg via INTRAMUSCULAR
  Filled 2019-11-24: qty 20

## 2019-11-24 NOTE — ED Provider Notes (Signed)
  Physical Exam  BP 122/77 (BP Location: Right Arm)   Pulse 91   Temp 97.6 F (36.4 C) (Axillary)   Resp 16   SpO2 98%   Physical Exam  ED Course/Procedures     Procedures  MDM  Patient was agitated. I ordered ativan, geodon, benadryl. Patient briefly required restraints while the meds were given. Repeat EKG showed nl QTc. Pending psych admission   ED ECG REPORT   Date: 11/24/2019  Rate: 73  Rhythm: normal sinus rhythm  QRS Axis: normal  Intervals: normal  ST/T Wave abnormalities: normal  Conduction Disutrbances:none  Narrative Interpretation:   Old EKG Reviewed: none available  I have personally reviewed the EKG tracing and agree with the computerized printout as noted.         Charlynne Pander, MD 11/24/19 [phone]

## 2019-11-24 NOTE — ED Provider Notes (Signed)
Emergency Medicine Observation Re-evaluation Note  David [name] is a 19 y.o. male, seen on rounds today.  Pt initially presented to the ED for complaints of Seizures and Aggressive Behavior Currently, the patient is resting quietly in bed..  Physical Exam  BP (!) 117/50 (BP Location: Right Arm)   Pulse 61   Temp 97.6 F (36.4 C) (Oral)   Resp 16   SpO2 98%  Physical Exam  ED Course / MDM  EKG:EKG Interpretation  Date/Time:  Wednesday November 19 2019 14:44:10 EDT Ventricular Rate:  84 PR Interval:    QRS Duration: 87 QT Interval:  331 QTC Calculation: 392 R Axis:   70 Text Interpretation: Sinus rhythm Borderline Q waves in inferior leads Borderline repolarization abnormality Baseline wander in lead(s) II III aVF 12 Lead; Mason-Likar No STEMI Confirmed by Alona Bene [phone]) on 11/20/2019 8:17:47 PM    I have reviewed the labs performed to date as well as medications administered while in observation.  Recent changes in the last 24 hours include social work looking for new group home. Plan  Current plan is for waiting for acceptance to a new group home. Patient is under full IVC at this time.   Terrilee Files, MD 11/24/19 [phone]

## 2019-11-24 NOTE — ED Notes (Signed)
Pt becoming increasely agitated. Throwing call bell, slamming down and locking himself in the bathroom. EDP notified and security at bedside. Medication given.

## 2019-11-24 NOTE — ED Notes (Signed)
Pt standing in the doorway and stating that he wants to leave. Pt informed that he will be staying here in the hospital tonight and re-informed about the POC for him. He had a shower this evening and is following instructions at this time.

## 2019-11-24 NOTE — Progress Notes (Addendum)
TOC CM spoke to [facility] in [place], Admission Coordinator fax # [phone] states they can possibly admit on the acute area as pt does not qualify for residential due to [facility] payor. She will have Clinical Admissions team review updated clinicals. She will reach out to pt's [facility] Acute Coordinator, Carey Bullocks. Isidoro Donning RN CCM, [facility] ED TOC CM [phone]

## 2019-11-25 LAB — CBG MONITORING, ED
Glucose-Capillary: 164 mg/dL — ABNORMAL HIGH (ref 70–99)
Glucose-Capillary: 63 mg/dL — ABNORMAL LOW (ref 70–99)

## 2019-11-25 LAB — BASIC METABOLIC PANEL
Anion gap: 7 (ref 5–15)
BUN: 14 mg/dL (ref 6–20)
CO2: 27 mmol/L (ref 22–32)
Calcium: 8.2 mg/dL — ABNORMAL LOW (ref 8.9–10.3)
Chloride: 104 mmol/L (ref 98–111)
Creatinine, Ser: 1.03 mg/dL (ref 0.61–1.24)
GFR calc Af Amer: >60 mL/min (ref >60)
GFR calc non Af Amer: >60 mL/min (ref >60)
Glucose, Bld: 227 mg/dL — ABNORMAL HIGH (ref 70–99)
Potassium: 3.9 mmol/L (ref 3.5–5.1)
Sodium: 138 mmol/L (ref 135–145)

## 2019-11-25 LAB — CBC WITH DIFFERENTIAL/PLATELET
Abs Immature Granulocytes: 0.01 10*3/uL (ref 0.00–0.07)
Basophils Absolute: 0 10*3/uL (ref 0.0–0.1)
Basophils Relative: 0 %
Eosinophils Absolute: 0.2 10*3/uL (ref 0.0–0.5)
Eosinophils Relative: 4 %
HCT: 42.5 % (ref 39.0–52.0)
Hemoglobin: 13.8 g/dL (ref 13.0–17.0)
Immature Granulocytes: 0 %
Lymphocytes Relative: 46 %
Lymphs Abs: 1.7 10*3/uL (ref 0.7–4.0)
MCH: 29.2 pg (ref 26.0–34.0)
MCHC: 32.5 g/dL (ref 30.0–36.0)
MCV: 90 fL (ref 80.0–100.0)
Monocytes Absolute: 0.3 10*3/uL (ref 0.1–1.0)
Monocytes Relative: 7 %
Neutro Abs: 1.6 10*3/uL — ABNORMAL LOW (ref 1.7–7.7)
Neutrophils Relative %: 43 %
Platelets: 145 10*3/uL — ABNORMAL LOW (ref 150–400)
RBC: 4.72 MIL/uL (ref 4.22–5.81)
RDW: 12.7 % (ref 11.5–15.5)
WBC: 3.7 10*3/uL — ABNORMAL LOW (ref 4.0–10.5)
nRBC: 0 % (ref 0.0–0.2)

## 2019-11-25 LAB — VALPROIC ACID LEVEL: Valproic Acid Lvl: 97 ug/mL (ref 50.0–100.0)

## 2019-11-25 MED ORDER — DEXTROSE 50 % IV SOLN: 25.0000 mL | Freq: Once | INTRAVENOUS | Status: DC

## 2019-11-25 MED ORDER — DEXTROSE 50 % IV SOLN
INTRAVENOUS | Status: AC
  Filled 2019-11-25: qty 50

## 2019-11-25 MED ORDER — DEXTROSE 50 % IV SOLN
50.0000 mL | Freq: Once | INTRAVENOUS | Status: AC
  Administered 2019-11-25: 50 mL via INTRAVENOUS

## 2019-11-25 MED ORDER — LEVETIRACETAM IN NACL 1000 MG/100ML IV SOLN
1000.0000 mg | Freq: Once | INTRAVENOUS | Status: AC
  Administered 2019-11-25: 1000 mg via INTRAVENOUS
  Filled 2019-11-25: qty 100

## 2019-11-25 MED ORDER — LORAZEPAM 2 MG/ML IJ SOLN
1.0000 mg | Freq: Once | INTRAMUSCULAR | Status: AC
  Administered 2019-11-25: 1 mg via INTRAVENOUS

## 2019-11-25 MED ORDER — LORAZEPAM 2 MG/ML IJ SOLN
INTRAMUSCULAR | Status: AC
  Filled 2019-11-25: qty 1

## 2019-11-25 NOTE — Social Work (Signed)
CSW spoke with Arnetha Courser, DSS Legal Guardian [phone].  She stated she currently does not have anywhere for the pt to go and he would be dc to streets as he is homeless currently.  [employer] [facility] will not accept pt back at homeless shelter.  She will contact Desiree, Admissions Coordinator at [facility] [phone] in [place].  CSW made Aram Beecham aware that pt needs to be dc'd.  Aram Beecham will call CSW back by 3pm today (08/25/2019).

## 2019-11-25 NOTE — Progress Notes (Addendum)
TOC CM contacted Desiree, Admission Coordinator at [facility] to follow up on admission. Waiting call back. Isidoro Donning RN CCM, [facility] ED TOC Caryl Ada [phone]  11/25/2019 4:16 pm TOC CM spoke to [place], Admission Coordinator with [facility], [place], [place] [phone], fax [phone]. Explained they have received necessary paperwork from pt's DSS Legal Guardian, Arnetha Courser. They will present pt's case to their medical team for review. Waiting decision if pt will accepted to their facility.   Contacted pt's [place] [facility]'s Acute Care Coordinator, Carey Bullocks, [phone]. Left HIPAA compliant messsage. Waiting call back. Spoke to Arnetha Courser, DSS Legal [phone] and states she is diligently working on case, she has not received any confirmations from any group homes that will accept patient. She has followed up with Cristie Hem, at [facility] and providing them with necessary paperwork. She plans to reach out to [facility] CSW on tomorrow with update. She has given permission to IVC patient as he cannot leave hospital. He is not safe to be left alone unsupervised. Isidoro Donning RN CCM, [facility] ED TOC CM [phone]  Pt was discussed in Quality Collaborative Meeting with Medical Advisor. Medical Advisor will follow up with ED provider to discuss patient's case. Isidoro Donning RN CCM, [facility] ED TOC CM [phone]

## 2019-11-25 NOTE — ED Notes (Signed)
Pt hit emergency button. NT found pt on knee and then laid down. Nurse was called. Pt did not respond, ER team called. MD assessed pt, pt transported on stretcher to RES B.

## 2019-11-25 NOTE — ED Notes (Signed)
Patient found in floor in [place]. Patient moved to RES B. IV placed, CBG checked, blood work sent, EKG, and full set of VS.   Patient then has another "seizure like activity". Charm Barges, MD made aware and verbal order given for 1 mg ativan.   Patient resting comfortable at this time. Patient responds to voice and will open eyes at this time.

## 2019-11-25 NOTE — ED Notes (Signed)
Per EDP. Feed patient.   Patient given meal tray. Patient asleep at this time.

## 2019-11-25 NOTE — ED Notes (Signed)
Pt currently eating and appears comfortable at this time.

## 2019-11-25 NOTE — ED Provider Notes (Signed)
Emergency Medicine Observation Re-evaluation Note  David [name] is a 19 y.o. male, seen on rounds today.  Pt initially presented to the ED for complaints of Seizures and Aggressive Behavior Currently, the patient is unresponsive.  Patient was observed by staff to be holding onto the wall and then to start shake.  By the time they approach the bed he had stopped shaking.  No unresponsive.  I was at the bedside within 2 minutes.  No obvious seizure activity.  When tried to visualize his eyes patient forcibly clenched his eyes closed.  Responsive to IV placement.  Still nonverbal.  Moved over to resuscitation bay.  Initial fingerstick 65 and D50 given.  Placed on cardiac monitor.Marland Kitchen  Physical Exam  BP (!) 98/53 (BP Location: Right Arm)   Pulse 72   Temp 98.6 F (37 C) (Oral)   Resp 17   SpO2 98%  Physical Exam  ED Course / MDM  EKG:EKG Interpretation  Date/Time:  Wednesday November 19 2019 14:44:10 EDT Ventricular Rate:  84 PR Interval:    QRS Duration: 87 QT Interval:  331 QTC Calculation: 392 R Axis:   70 Text Interpretation: Sinus rhythm Borderline Q waves in inferior leads Borderline repolarization abnormality Baseline wander in lead(s) II III aVF 12 Lead; Mason-Likar No STEMI Confirmed by Alona Bene [phone]) on 11/20/2019 8:17:47 PM    I have reviewed the labs performed to date as well as medications administered while in observation.  Recent changes in the last 24 hours include possible breakthrough seizures. Plan  Current plan is for repeating labs and placing on cardiac monitor.. Patient is under full IVC at this time.  Reviewed prior medical admission last month.  That was for breakthrough seizures.  He was on continuous EEG monitoring and had multiple spells that were nonepileptic in nature.  Neurology did stress that he also does have a known seizure disorder but that had no evidence of any seizures during that hospitalization.  Per patient's nurse he has had seizure-like  activity for behavioral reasons and does not like being in the back area where he is currently held.  We will continue to observe.  Patient now more alert.  Taking his p.o. meds.  Likely will return back to back to await placement.   Terrilee Files, MD 11/25/19 1739

## 2019-11-25 NOTE — ED Notes (Signed)
Patients IVC paper work in [employer] B bin at nurses station.

## 2019-11-25 NOTE — ED Notes (Signed)
Pt wandering back and forth in hall. Pt redirected multiple times to have a seat on stretcher.

## 2019-11-26 NOTE — Progress Notes (Addendum)
TOC CSW gave Arnetha Courser, DSS legal guardian [phone]  a  call in reference to discharge and an update on his placement.  CSW left HIPPA compliant message with my contact information.  Lindsey Hommel Tarpley-Carter, MSW, LCSW-A [facility] ED Transitions of Education administrator Health [phone]

## 2019-11-26 NOTE — ED Provider Notes (Addendum)
Patient stable today.  Patient still awaiting placement.  Patient's IVC expires today it will be renewed.  Appears that he is not can have any placement today.  Patient is awaiting a return back to group home.  Health counselors have advised me that we will need to continue his IVC because he cannot leave.   Vanetta Mulders, MD 11/26/19 1159    Vanetta Mulders, MD 11/26/19 [phone]

## 2019-11-26 NOTE — ED Notes (Addendum)
Pt asleep in bed, equal chest rise noted. Will continue to monitor, no sitter at bedside. Charge RN made aware.

## 2019-11-26 NOTE — ED Notes (Signed)
Enters my care. Report from [facility]. Pt resting in stretcher. NAD. No complaints voiced. No sitter with pt. Charge RN aware. Cont to monitor.

## 2019-11-26 NOTE — Social Work (Signed)
TOC CSW spoke with Arnetha Courser, DSS legal guardian [phone].  She stated no change in situation, although she does have 3 leads on transition facilities with no confirmation or acceptance.  CSW will follow up with Aram Beecham in the am for an update on her progress. CSW will still follow pt until dc'd.  Raechell Singleton Tarpley-Carter, MSW, LCSW-A [facility] ED Transitions of Education administrator Health [phone]

## 2019-11-26 NOTE — Social Work (Signed)
TOC CSW spoke with Deatra Robinson, NT about IVC order expiring today.  Clydie Braun informed CSW that IVC will expire at 6:29pm today.  CSW informed Clydie Braun that per Cathlean Cower TOC CM, Arnetha Courser, DSS Legal Guardian [phone] ,has given permission to IVC patient as he cannot leave hospital. He is not safe to be left alone unsupervised.   Clydie Braun then went to speak with Dr. Deretha Emory about IVC.  Dr. Deretha Emory confirmed he will look at disposition today.     Zerek Litsey Tarpley-Carter, MSW, LCSW-A [facility] ED Transitions of Education administrator Health [phone]

## 2019-11-26 NOTE — Progress Notes (Signed)
TOC CSW spoke with Deatra Robinson, NT about IVC.  Clydie Braun spoke w/ Dr. Deretha Emory.  He said he would look at disposition by 6pm before IVC expires today.  Per my note.  Mida Cory Tarpley-Carter, MSW, LCSW-A [facility] ED Transitions of Education administrator Health [phone]

## 2019-11-27 MED ORDER — TRAZODONE HCL 50 MG PO TABS
50.0000 mg | ORAL_TABLET | Freq: Every day | ORAL | Status: DC
  Administered 2019-11-27 – 2019-12-11 (×15): 50 mg via ORAL
  Filled 2019-11-27 (×15): qty 1

## 2019-11-27 NOTE — Social Work (Signed)
TOC CSW spoke with Arnetha Courser, DSS legal [phone].  Aram Beecham has been working diligently to find placement for pt. She has been in communications with someone at [facility] in [place].  Desiree at [facility] is evaluating pt for acceptance.  CSW will continue to follow pt until dc'd.  Zella Dewan Tarpley-Carter, MSW, LCSW-A [facility] ED Transitions of Care Clinical Social Worker [facility] [phone]

## 2019-11-27 NOTE — ED Provider Notes (Signed)
Emergency Medicine Observation Re-evaluation Note  David [name] is a 19 y.o. male, seen on rounds today.  Pt initially presented to the ED for complaints of Seizures and Aggressive Behavior Currently, the patient is awaiting placement.  Currently pleasant, sitting in bed playing a game on cell phone.  Physical Exam  BP (!) 95/50 (BP Location: Left Arm)   Pulse 68   Temp 98.2 F (36.8 C) (Oral)   Resp 18   SpO2 94%  Physical Exam Alert and nontoxic.  No respiratory distress.  Coordination activities. ED Course / MDM  EKG:EKG Interpretation  Date/Time:  Tuesday November 25 2019 10:35:06 EDT Ventricular Rate:  71 PR Interval:  136 QRS Duration: 88 QT Interval:  340 QTC Calculation: 370 R Axis:   72 Text Interpretation: Sinus arrhythmia No significant change since prior yeasterday Confirmed by Meridee Score [phone]) on 11/25/2019 10:44:42 AM    I have reviewed the labs performed to date as well as medications administered while in observation.  Recent changes in the last 24 hours include none. Plan  Current plan is for placement. Patient is under full IVC at this time. Patient is alert and cooperative currently pleasant.  No changes at this time.   Arby Barrette, MD 11/27/19 1205

## 2019-11-28 ENCOUNTER — Other Ambulatory Visit: Payer: Self-pay

## 2019-11-28 MED ORDER — STERILE WATER FOR INJECTION IJ SOLN
INTRAMUSCULAR | Status: AC
  Administered 2019-11-28: 1.2 mL
  Filled 2019-11-28: qty 10

## 2019-11-28 MED ORDER — ZIPRASIDONE MESYLATE 20 MG IM SOLR
20.0000 mg | INTRAMUSCULAR | Status: AC | PRN
  Administered 2019-11-28: 20 mg via INTRAMUSCULAR
  Filled 2019-11-28: qty 20

## 2019-11-28 MED ORDER — LORAZEPAM 1 MG PO TABS
1.0000 mg | ORAL_TABLET | ORAL | Status: AC | PRN
  Administered 2019-11-28: 1 mg via ORAL
  Filled 2019-11-28: qty 1

## 2019-11-28 MED ORDER — OLANZAPINE 10 MG PO TBDP
10.0000 mg | ORAL_TABLET | Freq: Three times a day (TID) | ORAL | Status: DC | PRN
  Filled 2019-11-28: qty 1

## 2019-11-28 NOTE — Social Work (Signed)
@  11:52am 11/28/2019 TOC CSW was contacted David [name], DSS [phone] updated CSW on her progress to find placement for David [name].  One of the three group home placements rejected her referral for placement.  She remains in contact with David [name] at [facility] for prospective placement.  She continues to search for prospective placements for David [name].  CSW will continue to follow David [name] until dc'd.  David [name], MSW, LCSW-A [facility] ED Transitions of Education administrator Health [phone]

## 2019-11-28 NOTE — ED Notes (Signed)
Patient having trouble sleeping and requesting medication for sleep. Notified Dr. Madilyn Hook. New order for PO trazodone ordered and given. Will continue to monitor.

## 2019-11-28 NOTE — ED Notes (Signed)
Pt alert this shift. Pt calm, cooperative, no s/s of distress.  Pt answers simple questions, pt mostly non verbal. Pt up at lib. Sitter at bedside this shift.

## 2019-11-28 NOTE — ED Provider Notes (Addendum)
Emergency Medicine Observation Re-evaluation Note  David [name] is a 19 y.o. male, seen on rounds today.  Pt initially presented to the ED for complaints of Seizures and Aggressive Behavior Currently, the patient is resting comfortably. No events overnight reported by RN.   Physical Exam  BP 120/75 (BP Location: Right Arm)   Pulse 70   Temp 98 F (36.7 C) (Oral)   Resp 18   SpO2 96%  Physical Exam Sleeping soundly, no distress.   ED Course / MDM  EKG:EKG Interpretation  Date/Time:  Tuesday November 25 2019 10:35:06 EDT Ventricular Rate:  71 PR Interval:  136 QRS Duration: 88 QT Interval:  340 QTC Calculation: 370 R Axis:   72 Text Interpretation: Sinus arrhythmia No significant change since prior yeasterday Confirmed by Meridee Score [phone]) on 11/25/2019 10:44:42 AM    I have reviewed the labs performed to date as well as medications administered while in observation.  Recent changes in the last 24 hours include none Plan  Current plan is for placement in Group Home Patient is under full IVC at this time.   Pollyann Savoy, MD 11/28/19 Carleene Overlie  Addendum: Called to bedside by RN for an episode of unresponsiveness. Patient did not have any seizure activity, back baseline alert and following commands on my arrival to room. Per RN, the patient has done this several times before. Continue to monitor and work towards placement in Group Home.     Pollyann Savoy, MD 11/28/19 [phone]

## 2019-11-28 NOTE — Progress Notes (Addendum)
Patient escalated and not cooperating. He is banging the doors, trying to leave, etc. Unable to direct patient even with security presence. Notified Dr. Stevie Kern. New order for 20 mg Geodon Im ordered and given. Security was present during medicine administration. Will continue to monitor.   Patient continued to escalate even after medication ordered. New order for non-violent restraints.   Entered room to place restraints and patient is now cooperative. Will not place restraints at this time.   Restraint order d/c'd due to restraints not being needed.

## 2019-11-29 MED ORDER — MELATONIN 5 MG PO TABS
5.0000 mg | ORAL_TABLET | Freq: Once | ORAL | Status: AC
  Administered 2019-11-29: 5 mg via ORAL
  Filled 2019-11-29: qty 1

## 2019-11-29 NOTE — ED Notes (Signed)
Pt.s breakfast has arrived, pt.s breakfast is sitting on his table. Will continue to monitor pt.

## 2019-11-29 NOTE — ED Provider Notes (Addendum)
Emergency Medicine Observation Re-evaluation Note  David [name] is a 19 y.o. male, seen on rounds today.  Pt initially presented to the ED for complaints of Seizures and Aggressive Behavior Currently, the patient is awaiting placement in a new group home.  Physical Exam  BP 107/66 (BP Location: Left Arm)   Pulse 61   Temp 97.9 F (36.6 C) (Oral)   Resp 18   SpO2 99%  Physical Exam  ED Course / MDM  EKG:EKG Interpretation  Date/Time:  Tuesday November 25 2019 10:35:06 EDT Ventricular Rate:  71 PR Interval:  136 QRS Duration: 88 QT Interval:  340 QTC Calculation: 370 R Axis:   72 Text Interpretation: Sinus arrhythmia No significant change since prior yeasterday Confirmed by Meridee Score [phone]) on 11/25/2019 10:44:42 AM    I have reviewed the labs performed to date as well as medications administered while in observation.  Recent changes in the last 24 hours include no new changes to. Plan  Current plan is for group home placement. Patient is under full IVC at this time.   Lorre Nick, MD 11/29/19 1003    Lorre Nick, MD 11/29/19 1004

## 2019-11-29 NOTE — ED Notes (Signed)
Pt playing with the rooms computer. Pt was told by this sitter and RN that he was not allowed to play with the computer. Pt now sitting in the corner of his room, messing with the bed. Will continue to monitor pt.

## 2019-11-29 NOTE — ED Notes (Signed)
Pt was upset this AM, not able to do what he wanted, pt redirected and reassured. Pt has been resting, sleeping.

## 2019-11-29 NOTE — ED Notes (Signed)
Pt kept acting out since he was not allowed to use the computer. Pt was reminded that he cannot touch the computer but he can listen to anything as long as this sitter is the only one touching the computer, as per requested by [employer]. After a while, pt shut off the computer and is laying on the ground. Beth RN trying to talk with the pt. Will continue to monitor pt.

## 2019-11-29 NOTE — ED Notes (Signed)
Pt has been sitting on his bed, would occassionally stand near the door to talk to this Clinical research associate or the RN. Would walk around his room. Pt would occasionally become agitated, but was easy to be redirected. Will continue to monitor pt.

## 2019-11-29 NOTE — ED Notes (Signed)
Pt has been resting since 0930 am. Attempted to get his noon vitals. Vitals could not be taken, will try again later. Beth RN in room, and aware of this. Will continue to monitor pt.

## 2019-11-29 NOTE — ED Notes (Signed)
Pt took a shower in the TCU area. Pt was cooperative and gave back all the items given to him to wash up. 3 towels, 2 washcloths, soap, and deodorant. Pt walked back to his room and is now brushing his teeth. Will continue to monitor pt.

## 2019-11-29 NOTE — ED Notes (Signed)
Pt tensed up and was not responding, pt eyes fluttering.  Pt became responsive and shook head to answer questions. VS taken. Provider notified.

## 2019-11-30 MED ORDER — MELATONIN 5 MG PO TABS
5.0000 mg | ORAL_TABLET | Freq: Every evening | ORAL | Status: DC | PRN
  Administered 2019-11-30 – 2019-12-09 (×4): 5 mg via ORAL
  Filled 2019-11-30 (×5): qty 1

## 2019-11-30 NOTE — ED Notes (Signed)
Pt has been polite and cooperative today.

## 2019-12-01 NOTE — Progress Notes (Signed)
TOC CSW was contacted Arnetha Courser, DSS [phone] for updates.  CSW spoke to Lendon Colonel who stated she is working with the 1st shift ED TOC CSW to have a team meeting but as of now Miss Caswell Corwin stated [facility] has received all necessary paperwork but has not yet provided and update.  Miss Caswell Corwin stated she is aware that time if of the essence due to time constraints in the ED and all other referrals to group home have either called back and denied the pt entry or have not yet returned calls.  Miss Stubb with DSS states that she is continuing to refer pt out to new group homes.  CSW will continue to follow for D/C needs.  [name]. Jasaiah Karwowski  MSW, LCSW, LCAS, CCS Transitions of Care Clinical Social Worker Care Coordination Department Ph: [phone]

## 2019-12-01 NOTE — ED Notes (Signed)
Pt alert this shift. Calm. Quiet guarded this shift. Pt medication compliant and cooperative.

## 2019-12-01 NOTE — ED Notes (Signed)
Pt alert and cooperative this shift. Pt medication compliant.

## 2019-12-03 NOTE — Progress Notes (Signed)
CSW received an update from DSS social worker Cynthias Caswell Corwin who stated that while there are no new updates from [facility] at this time, the following updates were provided:  While DSS is still awaiting for [facility] approval, placement is still being searched for at: [facility]: Processing an application for [facility] - there are several applicants ahead of him . Referral packet for [facility] has been submitted for review Shelly Coss will be submitting as the [facility] must submit this packet per [employer]). . DESS is still awaiting contact from Trenton Gammon who is the owner of 6 group homes in the [place] area.  . The other leads that DSS has contacted state that pt does not qualify or they only accept private pay.  In addition, while pt's ability to pay for a group home was in question due to a lack of due diligence on the part of the pt's mother, per DSS, to insure pt can still get "full benefits", "DSS has approved to pay an agency the Medicaid rate of $1,182.00 for placement until his income is secure".   Placement is also, per DSS, being currently attempted by pt's "Case Manager from school (Ms. [name]) has been working on finding placement too within her circle of contacts. She is also a support for him and will be visiting him this week at the hospital".  Per  Lendon Colonel from DSS who sent an email, "I know this is not much of an update but the effort is there. without any results though. This is a complex case so any assistance or guidance is greatly appreciated. I know that his time is limited at the hospital but he is not fit to be on his own and would be truly unsafe as I cannot monitor him 24/7 as the [employer]. The homeless shelter is not the best suited place for him either due to the continuous history of hospital visits from shelter."  CSW will continue to follow for D/C needs.  [name]. Laporsche Hoeger  MSW, LCSW, LCAS, CCS Transitions  of Care Clinical Social Worker Care Coordination Department Ph: [phone]

## 2019-12-03 NOTE — ED Notes (Signed)
Patient refuses to keep seizure pads on bed.  We have asked him multiple times and he continues to take them off.

## 2019-12-03 NOTE — ED Notes (Signed)
Patient awake and watching television.

## 2019-12-03 NOTE — ED Notes (Signed)
Patient has been calm and cooperative all day.  He takes his medication without difficulty.  He denies S/I and H/I.  Sitter remains with patient.

## 2019-12-04 NOTE — ED Notes (Signed)
Patient resting comfortably

## 2019-12-04 NOTE — Progress Notes (Addendum)
CSW called and spoke to David [name] who stated that in addition to her other attempts detailed in a previous email [name] spoke today to [employer] who owns a series of facilities including group homes who are interested in taking the pt, but [name] would like to see the pt and and assess the pt in person.  [name] with DSS was informed that an assessment in person or via HIPPA-compliant telehealth means can be facilitated by the [facility] ED [facility] Dept ASAP.  [name] stated she would let [name] know tonight via messaging and then will follow up with [name] via phone call on the morning of 12/05/19.  [name] will also provide an email detailing why the pt why the pt is not allowed back into the shelter and why the pt was not successful in the shelter or in a motel situation.  CSW relayed to [name] that pt cannot remain in the ED as pt is demonstrating appropriate behaviors and as such will be discharged and that if [name] can provide a preferred D/C plan it would have to happen tomorrow or shortly thereafter.  CSW pressed/requested [name] with DSS to facilitate a stakeholders conference call with the [facility] ED TOC CSW/RN CM,  with pt's Care Coordinator/LME, and other stakeholders to seek a joint effort in finding a safe discharge plan as a resolution ASAP.  [name] with DSS stated she will call the [facility] ED 1st shift ED CSW on the morning of 12/05/19 to schedule the above-mentionedconference call.  2nd shift ED CSW will leave handoff for 1st shift ED CSW.  CSW will continue to follow for D/C needs.  David [name]  MSW, LCSW, LCAS, CCS Transitions of Care Clinical Social Worker Care Coordination Department Ph: [phone]

## 2019-12-04 NOTE — ED Provider Notes (Signed)
  Physical Exam  BP (!) 105/34 (BP Location: Right Arm)   Pulse 66   Temp 98.2 F (36.8 C) (Axillary)   Resp 16   SpO2 98%   Physical Exam  ED Course/Procedures     Procedures  MDM  Called to see patient for seizure-like activity.  Patient is lying on his side.  Will not follow my commands but does have spontaneous movements.  Does have good threat reflex.  When I mentioned that I needed to see when the episodes happen patient then immediately began to start having more the activity.  His shaking did not appear consistent with epileptic activity.  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  Does not need further neurologic treatment for these events       Benjiman Core, MD 12/04/19 1042

## 2019-12-05 NOTE — ED Notes (Signed)
David [name] has been sleeping a lot today.  I woke him to give his medications and he was pleasant and cooperative.   Patient is in no distress and sitter is at doorway with him.

## 2019-12-05 NOTE — ED Provider Notes (Signed)
  Physical Exam  BP (!) 103/56   Pulse (!) 59   Temp 98 F (36.7 C) (Oral)   Resp 16   SpO2 96%   Physical Exam  ED Course/Procedures     Procedures  MDM  Patient still pending placement.       Benjiman Core, MD 12/05/19 1329

## 2019-12-07 NOTE — ED Notes (Signed)
Pt alert, cooperative, calm this shift. Pt medication compliant this shift.

## 2019-12-08 NOTE — ED Notes (Signed)
Pt has been calm and cooperative today. Took medications, but late because he was sleeping late and resistant to wake up.  Able to do ADLs without assist, just set up because of where he is. Very pleasant with staff. Behaviors appropriate.

## 2019-12-08 NOTE — ED Provider Notes (Signed)
Emergency Medicine Observation Re-evaluation Note  David [name] is a 19 y.o. male, seen on rounds today.  Pt initially presented to the ED for complaints of Seizures and Aggressive Behavior Currently, the patient is awaiting placement.  No acute events reported overnight.  Patient is without complaint on this morning's rounds.  Physical Exam  BP 102/61 (BP Location: Right Arm)   Pulse (!) 54   Temp 98.3 F (36.8 C) (Oral)   Resp 17   SpO2 98%  Physical Exam  ED Course / MDM  EKG:EKG Interpretation  Date/Time:  Tuesday November 25 2019 10:35:06 EDT Ventricular Rate:  71 PR Interval:  136 QRS Duration: 88 QT Interval:  340 QTC Calculation: 370 R Axis:   72 Text Interpretation: Sinus arrhythmia No significant change since prior yeasterday Confirmed by Meridee Score [phone]) on 11/25/2019 10:44:42 AM    I have reviewed the labs performed to date as well as medications administered while in observation.  Plan  Current plan is for pending placement.    Wynetta Fines, MD 12/08/19 [phone]

## 2019-12-08 NOTE — Progress Notes (Addendum)
CSW called Oralia Rud with the [facility] at ph: [phone] who confirmed the group home is an IDD-designated group home and that they have 1-2 beds available currently.  CSW obtained Mr. Susanne Borders email(see below) and was asked to send all of the pt's information via secure email to him now so the group home's legal team could review pt's referral.  CSW spoke to pt's Legal Guardian Arnetha Courser of DSS who provided verbal permission for the CSW to complete and send the referral to [facility] and who also agreed to send [facility] the information that she had.  11:46 PM  CSW sent Mr. Tasia Catchings a complete referral and placed a copy within the confines of the desk of the 1st shift ED CSW.  [email or web address]  CSW will continue to follow for D/C needs.  [name]. Samad Thon  MSW, LCSW, LCAS, CCS Transitions of Care Clinical Social Worker Care Coordination Department Ph: [phone]

## 2019-12-08 NOTE — Social Work (Signed)
CSW received an update from DSS social worker Cynthias Caswell Corwin, legal guardian who stated that while there are no new updates from [facility] at this time.  She has been in contact with Trenton Gammon who owns 6 group homes in [place].  There has not been any changes since her last update with York Grice, CSW.  DSS still has funding of $1,182.00 for placement.    Orchid Glassberg Tarpley-Carter, MSW, LCSW-A [facility] ED Transitions of Education administrator Health [phone]

## 2019-12-08 NOTE — ED Notes (Signed)
Per Janice Coffin the IVC has expired intentionally because it is not needed at this time. Pt does have a guardian.

## 2019-12-09 NOTE — ED Notes (Addendum)
Pt. Hollering hey nurse several times. Pt. Was seen with his head up under the bed with bed rail on his neck. Pt. Was told to get up. Pt. Came from up under the bed laughing. Writer took pt. Phone as it is privilege to have when having good behavior. He started slamming bathroom door, standing on toilet, sticking feet in toilet. Security called.  Also tried to take the glove holder off the wall. Pt. Was told if he can calm down and display good behavior he may have his phone back. Pt. Still continuing to walk out the room, going in another clean room, and also sitting on bathroom floor.   Pt. Stated he is bored. Security stated they would see about taking him out twice a day for fresh air.

## 2019-12-09 NOTE — ED Provider Notes (Signed)
Emergency Medicine Observation Re-evaluation Note  David [name] is a 19 y.o. male, seen on rounds today.  Pt initially presented to the ED for complaints of Seizures and Aggressive Behavior Currently, the patient is having no complaints and eating breakfast.  Physical Exam  BP (!) 102/35 (BP Location: Right Arm)   Pulse 62   Temp 98.3 F (36.8 C) (Oral)   Resp 17   SpO2 96%  Physical Exam NAD, calm and cooperative.  HR normal rate.  Resp: unlabored and no distress. ED Course / MDM  EKG:EKG Interpretation  Date/Time:  Tuesday November 25 2019 10:35:06 EDT Ventricular Rate:  71 PR Interval:  136 QRS Duration: 88 QT Interval:  340 QTC Calculation: 370 R Axis:   72 Text Interpretation: Sinus arrhythmia No significant change since prior yeasterday Confirmed by Meridee Score [phone]) on 11/25/2019 10:44:42 AM    I have reviewed the labs performed to date as well as medications administered while in observation.  Recent changes in the last 24 hours include no medical changes present. Plan  Current plan is for social work has been contacting group homes and has sent paperwork.  Group homes in [place] did have 1-2 openings available and waiting to hear back if he can occupy one of the spots.    Gwyneth Sprout, MD 12/09/19 [phone]

## 2019-12-09 NOTE — Social Work (Signed)
CSW spoke to pt's Legal Guardian David [name] of DSS.  David [name] reports that she was going to follow up with David [name] [facility] Owner).  David [name] also provided David [name] with the [facility] at ph: [phone] information for the group home's legal team to review.    CSW spoke with [name] with the [facility], he stated that his board will be presenting pts tomorrow.  It will take until Thursday (12/11/2019) or Friday (12/12/2019) of this week to reach a decision.    CSW will continue to follow for dc needs.  David [name], MSW, LCSW-A [facility] ED Transitions of Education administrator Health [phone]

## 2019-12-10 NOTE — Progress Notes (Addendum)
TOC CM contacted Christel Mormon [facility] Care Coordinator [phone] to follow up on pt's dc to group home. Requested she follow up with [facility] to discuss expediting their decision if they are able to accept pt as if they are not able to accept, the process will be prolonged and pt will remain in ED as a boarder. States she will contact pt's guardian, Arnetha Courser to discuss group home placement. Isidoro Donning RN CCM, [facility] ED TOC CM [phone]

## 2019-12-10 NOTE — ED Provider Notes (Signed)
Emergency Medicine Observation Re-evaluation Note  David [name] is a 19 y.o. male, seen on rounds today.  Pt initially presented to the ED for complaints of Seizures and Aggressive Behavior Currently, the patient is awaiting placement   Physical Exam  BP (!) 99/48 (BP Location: Left Arm)   Pulse 64   Temp 97.7 F (36.5 C) (Oral)   Resp 18   SpO2 98%  Physical Exam Sleeping soundly ED Course / MDM  EKG:EKG Interpretation  Date/Time:  Tuesday November 25 2019 10:35:06 EDT Ventricular Rate:  71 PR Interval:  136 QRS Duration: 88 QT Interval:  340 QTC Calculation: 370 R Axis:   72 Text Interpretation: Sinus arrhythmia No significant change since prior yeasterday Confirmed by Meridee Score [phone]) on 11/25/2019 10:44:42 AM    I have reviewed the labs performed to date as well as medications administered while in observation.  Recent changes in the last 24 hours include none Plan  Current plan is for group home placement  Patient is under full IVC at this time.   Pollyann Savoy, MD 12/10/19 [phone]

## 2019-12-11 NOTE — ED Provider Notes (Signed)
Emergency Medicine Observation Re-evaluation Note  David [name] is a 19 y.o. male, seen on rounds today.  Pt initially presented to the ED for complaints of Seizures and Aggressive Behavior Currently, the patient is awaiting placement.  Physical Exam  BP 99/60 (BP Location: Left Arm)    Pulse 69    Temp 98.6 F (37 C) (Oral)    Resp 16    SpO2 99%  Physical Exam No distress.  Sleeping quietly. ED Course / MDM  EKG:EKG Interpretation  Date/Time:  Tuesday November 25 2019 10:35:06 EDT Ventricular Rate:  71 PR Interval:  136 QRS Duration: 88 QT Interval:  340 QTC Calculation: 370 R Axis:   72 Text Interpretation: Sinus arrhythmia No significant change since prior yeasterday Confirmed by Meridee Score [phone]) on 11/25/2019 10:44:42 AM    I have reviewed the labs performed to date as well as medications administered while in observation.  Recent changes in the last 24 hours include none. Plan  Current plan is for group home placement. Patient is under full IVC at this time.   Arby Barrette, MD 12/11/19 [phone]

## 2019-12-11 NOTE — Progress Notes (Addendum)
TOC CM contacted DSS Legal Guardian, Arnetha Courser requesting a call from Supervisor to assist with group home placement for patient. Left message with Harvest Dark MS I/DD Care Coordinator, [phone] for return call for follow on placement. Isidoro Donning RN CCM, [facility] ED TOC CM [phone]  12/11/2019 333 pm Spoke to Harvest Dark, [facility] Acute Coordinator, states they are diligently looking for placement for pt. She has emailed me the list (see below), will continue to follow up with TOC CM in disposition. She is requesting notes from previous hospitalizations. Contacted Medical Records, she would have to submit a formal request by fax to medical records [phone]. Faxed pt current MAR with info on Medical Records fax number and contact number.    Placements Sought for [employer] per [place] Medicaid   1. [facility]- Waitlist pending Medicaid and benefits 08/2019 2. [facility]- denied 03/2019 Waitlist 3. [facility]- Sent over documents for owner to review- No reply/no interest 4. [facility] - 4/20/20201 Funding issue 5. [facility] - No openings 04/2019 6. [facility] - Severe/Profound Denied-No openings 05/2019 7. [facility]- Funding issue 07/2019 8. [facility] 08/2019 No openings 9. [facility] 08/2019 Denied 10. [facility] Denied 08/2019 11. [facility] - Denied- Does not fit population 08/2019 12. [facility] - No Openings 13. [facility]- No Openings- waitlist 08/2019 14. [facility]- Denied- Does not fit population 08/2019 15. [facility]- No openings  16. [facility] 11/18/2019- Awaiting response  17. [facility] 08/2019 18. [facility] 10/2019 19. [facility] again 12/05/2019 20. [facility] again 09/15/2019 21. [facility] 10/2019 22. [facility] 11/2019 23. [facility] 11/2019 24. [facility] 11/2019 25. [facility] 11/2019 26. [facility] 11/2019

## 2019-12-12 MED ORDER — LEVETIRACETAM 500 MG PO TABS: 500.0000 mg | ORAL_TABLET | Freq: Two times a day (BID) | ORAL | 0 refills | Status: DC

## 2019-12-12 MED ORDER — TRAZODONE HCL 50 MG PO TABS
50.0000 mg | ORAL_TABLET | Freq: Every day | ORAL | 0 refills | Status: DC
Start: 2019-12-12 — End: 2020-07-08

## 2019-12-12 MED ORDER — MELATONIN 10 MG PO CAPS: 5.0000 mg | ORAL_CAPSULE | Freq: Every evening | ORAL | 0 refills | Status: DC | PRN

## 2019-12-12 MED ORDER — RISPERIDONE 0.5 MG PO TABS
0.5000 mg | ORAL_TABLET | Freq: Two times a day (BID) | ORAL | 0 refills | Status: DC
Start: 2019-12-12 — End: 2020-07-08

## 2019-12-12 MED ORDER — DIVALPROEX SODIUM ER 250 MG PO TB24: 1250.0000 mg | ORAL_TABLET | Freq: Two times a day (BID) | ORAL | 0 refills | Status: DC

## 2019-12-12 MED ORDER — OLANZAPINE 10 MG PO TBDP: 10.0000 mg | ORAL_TABLET | Freq: Three times a day (TID) | ORAL | 1 refills | Status: DC | PRN

## 2019-12-12 MED FILL — DIVALPROEX SOD ER 250 MG TA: 250 | 30 days supply | Qty: 300 | Fill #0

## 2019-12-12 MED FILL — OLANZapine 5 MG TABS: 5 | 30 days supply | Qty: 30 | Fill #0

## 2019-12-12 MED FILL — risperiDONE 0.5 MG TABS: 0.5 | 30 days supply | Qty: 60 | Fill #0

## 2019-12-12 MED FILL — traZODone HCL 50 MG TABS: 50 | 30 days supply | Qty: 30 | Fill #0

## 2019-12-12 MED FILL — levETIRAcetam 500 MG TABS: 500 | 30 days supply | Qty: 60 | Fill #0

## 2019-12-12 NOTE — ED Notes (Signed)
Cathlean Cower SW escorted pt off the unit to [facility] who is picking pt up for his new group home. Pt so happy and excited to be leaving. Belongings verified by pt and given to him. Pt behavior has been very calm and cooperative and pleasant.

## 2019-12-12 NOTE — ED Notes (Signed)
Pt has been calm and cooperative today. Redirects easily. Able to complete ADLs with set up.

## 2019-12-12 NOTE — Progress Notes (Signed)
TOC CM spoke to Vaughan Sine, [place] Acute Care Coordiator and Corliss Blacker, supervisor Sander Radon DSS Supervisor. Email confirmation received Toya Smothers, DSS Supervisor. Pt will be discharged to [facility] today, Sander Radon, DSS Supervisor will pick him up today. She is requesting copy of medications, FL2 and AVS. ED RN update, Diane updated.Isidoro Donning RN CCM, [facility] ED TOC CM [phone]

## 2019-12-12 NOTE — Progress Notes (Signed)
CSW received a call from Sander Radon with DSS who states that she is en route to pick up the pt at 5pm and that placement has been found for the pt.  RN/EDP updated.  CSW will continue to follow for D/C needs.  David [name]. Makayla Confer  MSW, LCSW, LCAS, CCS Transitions of Care Clinical Social Worker Care Coordination Department Ph: [phone]

## 2019-12-12 NOTE — ED Provider Notes (Signed)
Social worker contacted me and said this patient is safe for discharge back to the group home, and her guardian would come see them and take them home.  They feel comfortable with an outpatient plan.  I reassessed the patient he agrees he will be safe at home he is safe for discharge.  IVC paperwork is rescinded by myself and the patient is discharged.   Sabino Donovan, MD 12/12/19 [phone]

## 2019-12-12 NOTE — ED Notes (Signed)
Pt pleasant and cooperative.

## 2020-01-26 MED FILL — Risperidone Tab 0.5 MG: ORAL | Qty: 0.5 | Status: AC

## 2020-01-26 MED FILL — Ziprasidone Mesylate For Inj 20 MG (Base Equivalent): INTRAMUSCULAR | Qty: 10 | Status: AC

## 2020-01-26 MED FILL — Lorazepam Tab 1 MG: ORAL | Qty: 1 | Status: AC

## 2020-01-26 MED FILL — Levetiracetam Tab 500 MG: ORAL | Qty: 500 | Status: AC

## 2020-01-26 MED FILL — Diphenhydramine HCl Inj 50 MG/ML: INTRAMUSCULAR | Qty: 1 | Status: AC

## 2020-01-26 MED FILL — Lorazepam Inj 2 MG/ML: INTRAMUSCULAR | Qty: 0.5 | Status: AC

## 2020-01-26 MED FILL — Divalproex Sodium Tab ER 24 HR 500 MG: ORAL | Qty: 1000 | Status: AC

## 2020-01-26 MED FILL — Trazodone HCl Tab 50 MG: ORAL | Qty: 50 | Status: AC

## 2020-01-26 MED FILL — Trazodone HCl Tab 100 MG: ORAL | Qty: 50 | Status: AC

## 2020-01-26 MED FILL — Divalproex Sodium Tab ER 24 HR 250 MG: ORAL | Qty: 250 | Status: AC

## 2020-03-25 ENCOUNTER — Encounter (HOSPITAL_COMMUNITY): Payer: Self-pay | Admitting: Emergency Medicine

## 2020-03-25 ENCOUNTER — Emergency Department (HOSPITAL_COMMUNITY)
Admission: EM | Admit: 2020-03-25 | Discharge: 2020-03-25 | Disposition: A | Discharge status: Home / Self Care | Payer: Medicaid Other | Attending: Emergency Medicine | Admitting: Emergency Medicine

## 2020-03-25 ENCOUNTER — Other Ambulatory Visit: Payer: Self-pay

## 2020-03-25 ENCOUNTER — Emergency Department (HOSPITAL_COMMUNITY): Payer: Medicaid Other

## 2020-03-25 DIAGNOSIS — R569 Unspecified convulsions: Secondary | ICD-10-CM | POA: Insufficient documentation

## 2020-03-25 DIAGNOSIS — F840 Autistic disorder: Secondary | ICD-10-CM | POA: Insufficient documentation

## 2020-03-25 DIAGNOSIS — Z7722 Contact with and (suspected) exposure to environmental tobacco smoke (acute) (chronic): Secondary | ICD-10-CM | POA: Insufficient documentation

## 2020-03-25 DIAGNOSIS — R0781 Pleurodynia: Secondary | ICD-10-CM | POA: Diagnosis not present

## 2020-03-25 LAB — COMPREHENSIVE METABOLIC PANEL
ALT: 27 U/L (ref 0–44)
AST: 35 U/L (ref 15–41)
Albumin: 4 g/dL (ref 3.5–5.0)
Alkaline Phosphatase: 64 U/L (ref 38–126)
Anion gap: 9 (ref 5–15)
BUN: 11 mg/dL (ref 6–20)
CO2: 26 mmol/L (ref 22–32)
Calcium: 9.4 mg/dL (ref 8.9–10.3)
Chloride: 104 mmol/L (ref 98–111)
Creatinine, Ser: 1 mg/dL (ref 0.61–1.24)
GFR, Estimated: >60 mL/min (ref >60)
Glucose, Bld: 91 mg/dL (ref 70–99)
Potassium: 4.3 mmol/L (ref 3.5–5.1)
Sodium: 139 mmol/L (ref 135–145)
Total Bilirubin: 0.7 mg/dL (ref 0.3–1.2)
Total Protein: 8 g/dL (ref 6.5–8.1)

## 2020-03-25 LAB — CBC
HCT: 45.7 % (ref 39.0–52.0)
Hemoglobin: 15.2 g/dL (ref 13.0–17.0)
MCH: 29.5 pg (ref 26.0–34.0)
MCHC: 33.3 g/dL (ref 30.0–36.0)
MCV: 88.7 fL (ref 80.0–100.0)
Platelets: 131 10*3/uL — ABNORMAL LOW (ref 150–400)
RBC: 5.15 MIL/uL (ref 4.22–5.81)
RDW: 13.5 % (ref 11.5–15.5)
WBC: 3.5 10*3/uL — ABNORMAL LOW (ref 4.0–10.5)
nRBC: 0 % (ref 0.0–0.2)

## 2020-03-25 LAB — CBG MONITORING, ED: Glucose-Capillary: 132 mg/dL — ABNORMAL HIGH (ref 70–99)

## 2020-03-25 LAB — VALPROIC ACID LEVEL: Valproic Acid Lvl: 149 ug/mL — ABNORMAL HIGH (ref 50.0–100.0)

## 2020-03-25 MED ORDER — LORAZEPAM 1 MG PO TABS
1.0000 mg | ORAL_TABLET | Freq: Once | ORAL | Status: AC
  Administered 2020-03-25: 1 mg via ORAL
  Filled 2020-03-25: qty 1

## 2020-03-25 NOTE — ED Triage Notes (Signed)
Patient arrives via EMS from [facility], facility reports witnessed seizure from standing position, reports flank pain, denies neck or back pain. Was able to stand and walk shortly after the seizure. Per EMS, pt is A&O to baseline.
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
## 2020-03-25 NOTE — ED Notes (Addendum)
Legal guardian has been updated on patient's status. Knows he is awaiting [facility] to take him back home.

## 2020-03-25 NOTE — ED Notes (Signed)
Seizure pads placed on bed rails and pt placed on cardiac monitoring. Pt was repositioned more comfortably and vitals obtained.

## 2020-03-25 NOTE — ED Notes (Signed)
[facility] called and notified of patient's return. [facility] called for transport.

## 2020-03-25 NOTE — Discharge Instructions (Addendum)
You were evaluated in the Emergency Department and after careful evaluation, we did not find any emergent condition requiring admission or further testing in the hospital.  Your exam/testing today was overall reassuring.  Please return to the Emergency Department if you experience any worsening of your condition.  Thank you for allowing us to be a part of your care.  

## 2020-04-19 ENCOUNTER — Emergency Department (HOSPITAL_COMMUNITY)
Admission: EM | Admit: 2020-04-19 | Discharge: 2020-04-20 | Disposition: A | Discharge status: Home / Self Care | Payer: Medicaid Other | Attending: Emergency Medicine | Admitting: Emergency Medicine

## 2020-04-19 ENCOUNTER — Encounter (HOSPITAL_COMMUNITY): Payer: Self-pay

## 2020-04-19 ENCOUNTER — Emergency Department (HOSPITAL_COMMUNITY): Payer: Medicaid Other

## 2020-04-19 ENCOUNTER — Other Ambulatory Visit: Payer: Self-pay

## 2020-04-19 DIAGNOSIS — R569 Unspecified convulsions: Secondary | ICD-10-CM | POA: Insufficient documentation

## 2020-04-19 DIAGNOSIS — R197 Diarrhea, unspecified: Secondary | ICD-10-CM | POA: Diagnosis not present

## 2020-04-19 DIAGNOSIS — Z79899 Other long term (current) drug therapy: Secondary | ICD-10-CM | POA: Diagnosis not present

## 2020-04-19 DIAGNOSIS — E039 Hypothyroidism, unspecified: Secondary | ICD-10-CM | POA: Diagnosis not present

## 2020-04-19 DIAGNOSIS — F840 Autistic disorder: Secondary | ICD-10-CM | POA: Diagnosis not present

## 2020-04-19 DIAGNOSIS — Z7722 Contact with and (suspected) exposure to environmental tobacco smoke (acute) (chronic): Secondary | ICD-10-CM | POA: Insufficient documentation

## 2020-04-19 DIAGNOSIS — S00512A Abrasion of oral cavity, initial encounter: Secondary | ICD-10-CM | POA: Insufficient documentation

## 2020-04-19 DIAGNOSIS — R1030 Lower abdominal pain, unspecified: Secondary | ICD-10-CM | POA: Diagnosis not present

## 2020-04-19 LAB — COMPREHENSIVE METABOLIC PANEL
ALT: 24 U/L (ref 0–44)
AST: 32 U/L (ref 15–41)
Albumin: 3.9 g/dL (ref 3.5–5.0)
Alkaline Phosphatase: 59 U/L (ref 38–126)
Anion gap: 8 (ref 5–15)
BUN: 10 mg/dL (ref 6–20)
CO2: 25 mmol/L (ref 22–32)
Calcium: 9.7 mg/dL (ref 8.9–10.3)
Chloride: 103 mmol/L (ref 98–111)
Creatinine, Ser: 0.89 mg/dL (ref 0.61–1.24)
GFR, Estimated: >60 mL/min (ref >60)
Glucose, Bld: 105 mg/dL — ABNORMAL HIGH (ref 70–99)
Potassium: 4 mmol/L (ref 3.5–5.1)
Sodium: 136 mmol/L (ref 135–145)
Total Bilirubin: 0.4 mg/dL (ref 0.3–1.2)
Total Protein: 7.8 g/dL (ref 6.5–8.1)

## 2020-04-19 LAB — CBC
HCT: 45.2 % (ref 39.0–52.0)
Hemoglobin: 15.1 g/dL (ref 13.0–17.0)
MCH: 29.6 pg (ref 26.0–34.0)
MCHC: 33.4 g/dL (ref 30.0–36.0)
MCV: 88.6 fL (ref 80.0–100.0)
Platelets: 126 10*3/uL — ABNORMAL LOW (ref 150–400)
RBC: 5.1 MIL/uL (ref 4.22–5.81)
RDW: 13.4 % (ref 11.5–15.5)
WBC: 5.1 10*3/uL (ref 4.0–10.5)
nRBC: 0 % (ref 0.0–0.2)

## 2020-04-19 LAB — LACTIC ACID, PLASMA: Lactic Acid, Venous: 1.3 mmol/L (ref 0.5–1.9)

## 2020-04-19 LAB — CBG MONITORING, ED: Glucose-Capillary: 120 mg/dL — ABNORMAL HIGH (ref 70–99)

## 2020-04-19 LAB — VALPROIC ACID LEVEL: Valproic Acid Lvl: 103 ug/mL — ABNORMAL HIGH (ref 50.0–100.0)

## 2020-04-19 MED ORDER — LORAZEPAM 2 MG/ML IJ SOLN
INTRAMUSCULAR | Status: AC
  Administered 2020-04-19: 2 mg via INTRAMUSCULAR
  Filled 2020-04-19: qty 2

## 2020-04-19 MED ORDER — SODIUM CHLORIDE 0.9 % IV BOLUS
1000.0000 mL | Freq: Once | INTRAVENOUS | Status: AC
  Administered 2020-04-19: 1000 mL via INTRAVENOUS

## 2020-04-19 NOTE — ED Notes (Signed)
[facility] contacted for transport. Was told it was going to be a long wait

## 2020-04-19 NOTE — Discharge Instructions (Addendum)
You were evaluated in the Emergency Department and after careful evaluation, we did not find any emergent condition requiring admission or further testing in the hospital.  Your exam/testing today was overall reassuring.  Please return to the Emergency Department if you experience any worsening of your condition.  Thank you for allowing us to be a part of your care.  
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
## 2020-04-19 NOTE — ED Notes (Addendum)
Spoke with legal guardian Ms.Caswell Corwin. Explained patient was being discharged and would need a ride back to group home. Ms.Stubbs stated there was no one to come pick him up. Please call non Emergency transport

## 2020-04-20 ENCOUNTER — Emergency Department (HOSPITAL_COMMUNITY)
Admission: EM | Admit: 2020-04-20 | Discharge: 2020-04-21 | Disposition: A | Discharge status: Home / Self Care | Payer: Medicaid Other | Source: Home / Self Care | Attending: Emergency Medicine | Admitting: Emergency Medicine

## 2020-04-20 ENCOUNTER — Encounter (HOSPITAL_COMMUNITY): Payer: Self-pay

## 2020-04-20 DIAGNOSIS — F840 Autistic disorder: Secondary | ICD-10-CM | POA: Insufficient documentation

## 2020-04-20 DIAGNOSIS — R197 Diarrhea, unspecified: Secondary | ICD-10-CM

## 2020-04-20 DIAGNOSIS — Z7722 Contact with and (suspected) exposure to environmental tobacco smoke (acute) (chronic): Secondary | ICD-10-CM | POA: Insufficient documentation

## 2020-04-20 DIAGNOSIS — Z79899 Other long term (current) drug therapy: Secondary | ICD-10-CM | POA: Insufficient documentation

## 2020-04-20 DIAGNOSIS — R1030 Lower abdominal pain, unspecified: Secondary | ICD-10-CM | POA: Insufficient documentation

## 2020-04-20 DIAGNOSIS — E039 Hypothyroidism, unspecified: Secondary | ICD-10-CM | POA: Insufficient documentation

## 2020-04-20 NOTE — ED Notes (Signed)
Non emergency transport called for transfer back to group home.

## 2020-04-20 NOTE — ED Notes (Signed)
Assisted living called and report was given. [facility] has been called to transport pt.
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
## 2020-04-20 NOTE — ED Triage Notes (Signed)
Pt from [facility] with ems for lower abd pain that started today and 1 episode of diarrhea. Pt took imodium PTA.
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
## 2020-04-20 NOTE — Discharge Instructions (Signed)
Please return to the ER for any new or worsening symptoms.  You may continue to take Imodium for pain, start with a bland diet as described in the handout

## 2020-04-21 ENCOUNTER — Encounter (HOSPITAL_COMMUNITY): Payer: Self-pay

## 2020-04-21 ENCOUNTER — Emergency Department (HOSPITAL_COMMUNITY): Payer: Medicaid Other

## 2020-04-21 ENCOUNTER — Emergency Department (HOSPITAL_COMMUNITY)
Admission: EM | Admit: 2020-04-21 | Discharge: 2020-04-21 | Disposition: A | Discharge status: Home / Self Care | Payer: Medicaid Other | Attending: Emergency Medicine | Admitting: Emergency Medicine

## 2020-04-21 DIAGNOSIS — R569 Unspecified convulsions: Secondary | ICD-10-CM | POA: Diagnosis present

## 2020-04-21 DIAGNOSIS — R1030 Lower abdominal pain, unspecified: Secondary | ICD-10-CM | POA: Diagnosis not present

## 2020-04-21 DIAGNOSIS — Z7722 Contact with and (suspected) exposure to environmental tobacco smoke (acute) (chronic): Secondary | ICD-10-CM | POA: Diagnosis not present

## 2020-04-21 DIAGNOSIS — R197 Diarrhea, unspecified: Secondary | ICD-10-CM | POA: Insufficient documentation

## 2020-04-21 DIAGNOSIS — E039 Hypothyroidism, unspecified: Secondary | ICD-10-CM | POA: Insufficient documentation

## 2020-04-21 DIAGNOSIS — Z79899 Other long term (current) drug therapy: Secondary | ICD-10-CM | POA: Diagnosis not present

## 2020-04-21 DIAGNOSIS — F840 Autistic disorder: Secondary | ICD-10-CM | POA: Insufficient documentation

## 2020-04-21 LAB — COMPREHENSIVE METABOLIC PANEL
ALT: 23 U/L (ref 0–44)
AST: 33 U/L (ref 15–41)
Albumin: 3.8 g/dL (ref 3.5–5.0)
Alkaline Phosphatase: 63 U/L (ref 38–126)
Anion gap: 10 (ref 5–15)
BUN: 10 mg/dL (ref 6–20)
CO2: 27 mmol/L (ref 22–32)
Calcium: 9.5 mg/dL (ref 8.9–10.3)
Chloride: 104 mmol/L (ref 98–111)
Creatinine, Ser: 0.99 mg/dL (ref 0.61–1.24)
GFR, Estimated: >60 mL/min (ref >60)
Glucose, Bld: 109 mg/dL — ABNORMAL HIGH (ref 70–99)
Potassium: 4 mmol/L (ref 3.5–5.1)
Sodium: 141 mmol/L (ref 135–145)
Total Bilirubin: 0.4 mg/dL (ref 0.3–1.2)
Total Protein: 7.2 g/dL (ref 6.5–8.1)

## 2020-04-21 LAB — URINALYSIS, ROUTINE W REFLEX MICROSCOPIC
Bilirubin Urine: NEGATIVE
Glucose, UA: NEGATIVE mg/dL
Hgb urine dipstick: NEGATIVE
Ketones, ur: NEGATIVE mg/dL
Leukocytes,Ua: NEGATIVE
Nitrite: NEGATIVE
Protein, ur: NEGATIVE mg/dL
Specific Gravity, Urine: 1.036 — ABNORMAL HIGH (ref 1.005–1.030)
pH: 7 (ref 5.0–8.0)

## 2020-04-21 LAB — CBC WITH DIFFERENTIAL/PLATELET
Abs Immature Granulocytes: 0 10*3/uL (ref 0.00–0.07)
Basophils Absolute: 0 10*3/uL (ref 0.0–0.1)
Basophils Relative: 0 %
Eosinophils Absolute: 0.2 10*3/uL (ref 0.0–0.5)
Eosinophils Relative: 5 %
HCT: 44.5 % (ref 39.0–52.0)
Hemoglobin: 14.6 g/dL (ref 13.0–17.0)
Immature Granulocytes: 0 %
Lymphocytes Relative: 40 %
Lymphs Abs: 1.6 10*3/uL (ref 0.7–4.0)
MCH: 29.7 pg (ref 26.0–34.0)
MCHC: 32.8 g/dL (ref 30.0–36.0)
MCV: 90.6 fL (ref 80.0–100.0)
Monocytes Absolute: 0.6 10*3/uL (ref 0.1–1.0)
Monocytes Relative: 16 %
Neutro Abs: 1.5 10*3/uL — ABNORMAL LOW (ref 1.7–7.7)
Neutrophils Relative %: 39 %
Platelets: 126 10*3/uL — ABNORMAL LOW (ref 150–400)
RBC: 4.91 MIL/uL (ref 4.22–5.81)
RDW: 13.7 % (ref 11.5–15.5)
WBC: 3.9 10*3/uL — ABNORMAL LOW (ref 4.0–10.5)
nRBC: 0 % (ref 0.0–0.2)

## 2020-04-21 LAB — LIPASE, BLOOD: Lipase: 29 U/L (ref 11–51)

## 2020-04-21 LAB — VALPROIC ACID LEVEL: Valproic Acid Lvl: 117 ug/mL — ABNORMAL HIGH (ref 50.0–100.0)

## 2020-04-21 LAB — CBG MONITORING, ED: Glucose-Capillary: 116 mg/dL — ABNORMAL HIGH (ref 70–99)

## 2020-04-21 MED ORDER — IOHEXOL 300 MG/ML  SOLN
100.0000 mL | Freq: Once | INTRAMUSCULAR | Status: AC | PRN
  Administered 2020-04-21: 100 mL via INTRAVENOUS

## 2020-04-21 MED ORDER — FENTANYL CITRATE (PF) 100 MCG/2ML IJ SOLN
50.0000 ug | Freq: Once | INTRAMUSCULAR | Status: AC
  Administered 2020-04-21: 50 ug via INTRAVENOUS
  Filled 2020-04-21: qty 2

## 2020-04-21 MED ORDER — DIVALPROEX SODIUM ER 250 MG PO TB24: 1250.0000 mg | ORAL_TABLET | Freq: Two times a day (BID) | ORAL | 0 refills | Status: AC

## 2020-04-21 MED ORDER — LEVETIRACETAM 500 MG PO TABS: 500.0000 mg | ORAL_TABLET | Freq: Two times a day (BID) | ORAL | 0 refills | Status: DC

## 2020-04-21 MED ORDER — SODIUM CHLORIDE 0.9 % IV BOLUS
1000.0000 mL | Freq: Once | INTRAVENOUS | Status: AC
  Administered 2020-04-21: 1000 mL via INTRAVENOUS

## 2020-04-21 NOTE — ED Provider Notes (Signed)
[facility]-EMERGENCY DEPT Provider Note   CSN: [id number] Arrival date & time: 04/21/20  1353  LEVEL 5 CAVEAT - INTELLECTUAL DISABILITY History Chief Complaint  Patient presents with  . Seizures  . Abdominal Pain    David [name] is a 19 y.o. male.  HPI 19 year old male presents with seizure and abdominal pain.  He states his abdomen has been hurting since yesterday.  Notes a couple episodes of nonbloody diarrhea.  The patient also had a seizure today at the [facility] [employer].  EMS notes that the patient was shaking when they arrived but no postictal state.  There has been no fever.  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   Past Medical History:  Diagnosis Date  . Autism   . Intellectual disability    Moderate  . Limbic encephalitis   . Seizures (HCC)   . Speech delay   . Thyroid disease     Patient Active Problem List   Diagnosis Date Noted  . Autism 10/26/2019  . Recurrent seizures (HCC) 10/23/2019  . 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 Seizure disorder (HCC) 04/24/2018  . Seizure (HCC) 04/24/2017  . Acne 11/03/2016  . Depression 11/03/2016  . Intellectual disability 11/03/2016  . 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 Hypothyroidism 04/02/2008  . RHINITIS, ALLERGIC 07/26/2006    Past Surgical History:  Procedure Laterality Date  . BRAIN SURGERY     Brain biopsy  . GASTROSTOMY TUBE REVISION         History reviewed. No pertinent family history.  Social History   Tobacco Use  . Smoking status: Passive Smoke Exposure - Never Smoker  . Smokeless tobacco: Never Used  Substance Use Topics  . Alcohol use: No  . Drug use: No    Home Medications Prior to Admission medications   Medication Sig Start Date End Date Taking? Authorizing Provider    melatonin 5 MG TABS Take 5 mg by mouth at bedtime.   Yes [provider]  risperiDONE (RISPERDAL) 0.5 MG tablet Take 1 tablet (0.5 mg total) by mouth 2 (two) times daily. 10/28/19  Yes Ghimire, Werner Lean, MD  cholecalciferol (VITAMIN D3) 25 MCG (1000 UNIT) tablet Take 1 tablet (1,000 Units total) by mouth daily. Patient not taking: Reported on 04/21/2020 10/28/19   Maretta Bees, MD  divalproex (DEPAKOTE ER) 250 MG 24 hr tablet Take 5 tablets (1,250 mg total) by mouth 2 (two) times daily. 04/21/20 05/21/20  Pricilla Loveless, MD  levETIRAcetam (KEPPRA) 500 MG tablet Take 1 tablet (500 mg total) by mouth 2 (two) times daily. 04/21/20 05/21/20  Pricilla Loveless, MD  levothyroxine (SYNTHROID) 125 MCG tablet Take 0.5 tablets (62.5 mcg total) by mouth daily. Patient not taking: Reported on 04/21/2020 10/28/19 11/27/19  Maretta Bees, MD  Melatonin 10 MG CAPS Take 5 mg by mouth at bedtime as needed. Patient not taking: Reported on 04/21/2020 12/12/19   Sabino Donovan, MD  OLANZapine zydis (ZYPREXA) 10 MG disintegrating tablet Take 1 tablet (10 mg total) by mouth every 8 (eight) hours as needed for up to 30 doses. Patient not taking: Reported on 04/21/2020 12/12/19   Sabino Donovan, MD  risperiDONE (RISPERDAL) 0.5 MG tablet Take 1 tablet (0.5 mg total) by mouth 2 (two) times daily. Patient not taking: Reported on 04/21/2020 12/12/19 01/11/20  Sabino Donovan, MD  traZODone (DESYREL) 50 MG tablet Take 1 tablet (50 mg total) by mouth at bedtime. Patient not taking: Reported on 04/21/2020 12/12/19   Sabino Donovan, MD    Allergies    Penicillins  Review of Systems   Review of Systems  Constitutional: Negative for fever.  Gastrointestinal: Positive for abdominal pain and diarrhea. Negative for blood in stool and vomiting.  Neurological: Positive for seizures. Negative for headaches.  All other systems reviewed and are negative.   Physical Exam Updated Vital Signs BP 116/71   Pulse 64   Temp 98.7 F  (37.1 C) (Oral)   Resp (!) 23   SpO2 99%   Physical Exam Vitals and nursing note reviewed.  Constitutional:      General: He is not in acute distress.    Appearance: He is well-developed. He is not ill-appearing or diaphoretic.  HENT:     Head: Normocephalic and atraumatic.     Right Ear: External ear normal.     Left Ear: External ear normal.     Nose: Nose normal.     Mouth/Throat:     Comments: No obvious tongue injury Eyes:     General:        Right eye: No discharge.        Left eye: No discharge.     Extraocular Movements: Extraocular movements intact.     Pupils: Pupils are equal, round, and reactive to light.  Cardiovascular:     Rate and Rhythm: Normal rate and regular rhythm.     Heart sounds: Normal heart sounds.  Pulmonary:     Effort: Pulmonary effort is normal.     Breath sounds: Normal breath sounds.  Abdominal:     Palpations: Abdomen is soft.     Tenderness: There is abdominal tenderness in the right lower quadrant and left lower quadrant. There is guarding.  Musculoskeletal:     Cervical back: Neck supple.  Skin:    General: Skin is warm and dry.  Neurological:     Mental Status: He is alert.     Comments: CN 3-12 grossly intact. 5/5 strength in all 4 extremities. Grossly normal sensation. Normal finger to nose.   Psychiatric:        Mood and Affect: Mood is not anxious.     ED Results / Procedures / Treatments   Labs (all labs ordered are listed, but only abnormal results are displayed) Labs Reviewed  COMPREHENSIVE METABOLIC PANEL - Abnormal; Notable for the following components:      Result Value   Glucose, Bld 109 (*)    All other components within normal limits  CBC WITH DIFFERENTIAL/PLATELET - Abnormal; Notable for the following components:   WBC 3.9 (*)    Platelets 126 (*)    Neutro Abs 1.5 (*)    All other components within normal limits  URINALYSIS, ROUTINE W REFLEX MICROSCOPIC - Abnormal; Notable for the following components:    Specific Gravity, Urine 1.036 (*)    All other components within normal limits  VALPROIC ACID LEVEL - Abnormal; Notable for the following components:   Valproic Acid Lvl 117 (*)    All other components within normal limits  CBG MONITORING, ED - Abnormal; Notable for the following components:   Glucose-Capillary 116 (*)    All other components within normal limits  LIPASE, BLOOD    EKG EKG Interpretation  Date/Time:  Wednesday April 21 2020 15:35:27 EST Ventricular Rate:  65 PR Interval:    QRS Duration:  92 QT Interval:  333 QTC Calculation: 347 R Axis:   72 Text Interpretation: Sinus rhythm no significant change since Oct 2021 Confirmed by Pricilla LovelessGoldston, Ewell Benassi [phone]([id number]) on 04/21/2020 3:41:12 PM   Radiology CT HEAD WO CONTRAST  Result Date: 04/19/2020 CLINICAL DATA:  History of seizure disorder with seizure earlier today. Altercation and fall earlier today. EXAM: CT HEAD WITHOUT CONTRAST TECHNIQUE: Contiguous axial images were obtained from the base of the skull through the vertex without intravenous contrast. COMPARISON:  11/12/2019 FINDINGS: Brain: Areas of encephalomalacia involving the left frontotemporal region and with associated gyral calcifications. There is associated volume loss in the left temporal lobe with mild ventricular dilatation.The appearance is unchanged since the previous study. There is no new mass effect or midline shift. No abnormal extra-axial fluid collections. Gray-white matter junctions are distinct. Basal cisterns are not effaced. No acute intracranial hemorrhage. Vascular: No hyperdense vessel or unexpected calcification. Skull: Calvarium appears intact. No acute depressed skull fractures. Postoperative changes in the left temporal region with plate and screw fixation. Sinuses/Orbits: The paranasal sinuses and mastoid air cells are clear. Other: None. IMPRESSION: 1. No acute intracranial abnormalities. 2. Areas of encephalomalacia involving the left frontotemporal  region with associated gyral calcifications. There is associated volume loss in the left temporal lobe with mild ventricular dilatation. The appearance is unchanged since the previous study. 3. Postoperative changes in the left temporal region with plate and screw fixation. Electronically Signed   By: Burman NievesWilliam  Stevens M.D.   On: 04/19/2020 22:23   CT ABDOMEN PELVIS W CONTRAST  Result Date: 04/21/2020 CLINICAL DATA:  Seizure like activity.  Abdominal pain. EXAM: CT ABDOMEN AND PELVIS WITH CONTRAST TECHNIQUE: Multidetector CT imaging of the abdomen and pelvis was performed using the standard protocol following bolus administration of intravenous contrast. CONTRAST:  100mL OMNIPAQUE IOHEXOL 300 MG/ML  SOLN COMPARISON:  Radiography 06/17/2017 FINDINGS: Lower chest: Normal Hepatobiliary: Normal Pancreas: Normal Spleen: Normal Adrenals/Urinary Tract: Adrenal glands are normal. Kidneys are normal. Bladder is normal. Stomach/Bowel: Stomach and small intestine are normal. Appendix is normal. No colon pathology is seen. No sign of diverticulosis or diverticulitis. Vascular/Lymphatic: Normal Reproductive: Normal Other: No free fluid or air. Musculoskeletal: Mild scoliotic curvature convex to the left. No acute bone finding. IMPRESSION: Normal CT scan of the abdomen and pelvis. No abnormality seen to explain the presenting symptoms. Electronically Signed   By: Paulina FusiMark  Shogry M.D.   On: 04/21/2020 18:00    Procedures Procedures (including critical care time)  Medications Ordered in ED Medications  sodium chloride 0.9 % bolus 1,000 mL (0 mLs Intravenous Stopped 04/21/20 1703)  fentaNYL (SUBLIMAZE) injection 50 mcg (50 mcg Intravenous Given 04/21/20 1548)  iohexol (OMNIPAQUE) 300 MG/ML solution 100 mL (100 mLs Intravenous Contrast Given 04/21/20 1745)    ED Course  I have reviewed the triage vital signs and the nursing notes.  Pertinent labs & imaging results that were available during my care of the patient  were reviewed by me and considered in my medical decision making (see chart for details).    MDM Rules/Calculators/A&P                          Given the lower abdominal tenderness on exam and perpetual symptoms with return ED visit, CT abdomen and pelvis was obtained.  This has been personally reviewed and is benign.  Labs are unremarkable except mildly elevated Depakote level.  I discussed with neurology, Dr. Derry LoryKhaliqdina, who at this point recommends no  change in Depakote management.  Need outpatient neuro follow-up.  Otherwise no seizure-like activity since arrival to the ED.  Question whether this was true seizure-like activity versus pseudoseizure.  Discharged home. Final Clinical Impression(s) / ED Diagnoses Final diagnoses:  Lower abdominal pain  Seizure-like activity (HCC)    Rx / DC Orders ED Discharge Orders         Ordered    divalproex (DEPAKOTE ER) 250 MG 24 hr tablet  2 times daily        04/21/20 1944    levETIRAcetam (KEPPRA) 500 MG tablet  2 times daily        04/21/20 1944    Ambulatory referral to Neurology       Comments: An appointment is requested in approximately: 2 weeks   04/21/20 1944           Pricilla Loveless, MD 04/21/20 2002

## 2020-04-21 NOTE — ED Notes (Signed)
Pt asking for sandwich and chips/crackers

## 2020-04-21 NOTE — Progress Notes (Addendum)
TOC CM reviewed chart. Pt has frequent ED visits. Will transport back to [facility] via [place]. Will get consent from Legal Guardian, Aram Beecham. Verbal consent received from Aram Beecham to transport to [facility] via [facility]. [facility] form completed and emailed. Isidoro Donning RN CCM, [facility] ED TOC CM [phone]

## 2020-04-21 NOTE — ED Notes (Signed)
Safe transport has been called to transport patient back to the facility.

## 2020-04-21 NOTE — ED Triage Notes (Signed)
Bib ems from [facility] for seizure like activity. Pt was shaking when ems arrived but stopped when medics entered the room. Ems reports no post ictal state, pt aox4 immediately after shaking stopped. Pt c/o abdominal pain on arrival.

## 2020-04-21 NOTE — Discharge Instructions (Addendum)
If you develop worsening, continued, or recurrent abdominal pain, uncontrolled vomiting, fever, chest or back pain, or any other new/concerning symptoms then return to the ER for evaluation.  

## 2020-04-23 ENCOUNTER — Encounter (HOSPITAL_COMMUNITY): Payer: Self-pay

## 2020-04-23 ENCOUNTER — Emergency Department (HOSPITAL_COMMUNITY)
Admission: EM | Admit: 2020-04-23 | Discharge: 2020-04-23 | Disposition: A | Discharge status: Home / Self Care | Payer: Medicaid Other | Attending: Emergency Medicine | Admitting: Emergency Medicine

## 2020-04-23 ENCOUNTER — Other Ambulatory Visit: Payer: Self-pay

## 2020-04-23 DIAGNOSIS — Z7722 Contact with and (suspected) exposure to environmental tobacco smoke (acute) (chronic): Secondary | ICD-10-CM | POA: Insufficient documentation

## 2020-04-23 DIAGNOSIS — N4889 Other specified disorders of penis: Secondary | ICD-10-CM

## 2020-04-23 DIAGNOSIS — N4829 Other inflammatory disorders of penis: Secondary | ICD-10-CM | POA: Diagnosis present

## 2020-04-23 DIAGNOSIS — R569 Unspecified convulsions: Secondary | ICD-10-CM | POA: Insufficient documentation

## 2020-04-23 LAB — URINALYSIS, ROUTINE W REFLEX MICROSCOPIC
Bilirubin Urine: NEGATIVE
Glucose, UA: NEGATIVE mg/dL
Hgb urine dipstick: NEGATIVE
Ketones, ur: 5 mg/dL — AB
Leukocytes,Ua: NEGATIVE
Nitrite: NEGATIVE
Protein, ur: NEGATIVE mg/dL
Specific Gravity, Urine: 1.03 (ref 1.005–1.030)
pH: 6 (ref 5.0–8.0)

## 2020-04-23 MED ORDER — DIVALPROEX SODIUM 250 MG PO DR TAB
250.0000 mg | DELAYED_RELEASE_TABLET | Freq: Once | ORAL | Status: AC
  Administered 2020-04-23: 250 mg via ORAL
  Filled 2020-04-23: qty 1

## 2020-04-23 MED ORDER — LEVETIRACETAM 500 MG PO TABS
500.0000 mg | ORAL_TABLET | Freq: Once | ORAL | Status: AC
  Administered 2020-04-23: 500 mg via ORAL
  Filled 2020-04-23: qty 1

## 2020-04-23 NOTE — ED Notes (Signed)
Pt called out, upon entering room pt began having a seizure lasting less than 60 seconds. Pt was verbal with this RN post seizure. Pt is back at baseline and states he did not take his prescribed seizure meds today. Solon Augusta, PA made aware.

## 2020-04-23 NOTE — ED Notes (Signed)
Patient stated he a seizure and he urinated and states his penis hurts

## 2020-04-23 NOTE — Discharge Instructions (Signed)
Your urine is without any blood present.  Please follow up with your primary care doctor.  Your STD testing is pending. We will contact you if anything is positive.

## 2020-04-23 NOTE — ED Notes (Signed)
Seizure pads on bed (L) (R) 

## 2020-04-23 NOTE — ED Triage Notes (Signed)
Pt BIB EMS from [facility]. Pt is autistic. Pt c/o hematuria and penile pain.   120/90 100% RA

## 2020-04-23 NOTE — ED Notes (Signed)
Pt given ham sand which and water.

## 2020-04-23 NOTE — ED Provider Notes (Addendum)
[facility]-EMERGENCY DEPT Provider Note   CSN: [id number] Arrival date & time: 04/23/20  1740     History Chief Complaint  Patient presents with  . Hematuria  . Penis Pain    David [name] is a 19 y.o. male.  HPI  Level 5 caveat secondary to intellectual disability.  Patient is 19 year old male presented today with penis pain that started today after he had a seizure.  He is unable to describe it to me and laughs when I ask him questions about this.  Patient denies any abdominal pain, nausea, vomiting or diarrhea.  He denies any sexual activity.  I asked her if he had any of his pain yesterday and he states now.  Notably he has been seen 3 times in the past week for various complaints. He is currently living in a group home.      Past Medical History:  Diagnosis Date  . Autism   . Intellectual disability    Moderate  . Limbic encephalitis   . Seizures (HCC)   . Speech delay   . Thyroid disease     Patient Active Problem List   Diagnosis Date Noted  . Autism 10/26/2019  . Recurrent seizures (HCC) 10/23/2019  . 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 Seizure disorder (HCC) 04/24/2018  . Seizure (HCC) 04/24/2017  . Acne 11/03/2016  . Depression 11/03/2016  . Intellectual disability 11/03/2016  . 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 Hypothyroidism 04/02/2008  . RHINITIS, ALLERGIC 07/26/2006    Past Surgical History:  Procedure Laterality Date  . BRAIN SURGERY     Brain biopsy  . GASTROSTOMY TUBE REVISION         History reviewed. No pertinent family history.  Social History   Tobacco Use  . Smoking status: Passive Smoke Exposure - Never Smoker  . Smokeless tobacco: Never Used  Substance Use Topics  . Alcohol use: No  . Drug use: No    Home Medications Prior to Admission medications     Medication Sig Start Date End Date Taking? Authorizing Provider  cholecalciferol (VITAMIN D3) 25 MCG (1000 UNIT) tablet Take 1 tablet (1,000 Units total) by mouth daily. Patient not taking: Reported on 04/21/2020 10/28/19   Maretta Bees, MD  divalproex (DEPAKOTE ER) 250 MG 24 hr tablet Take 5 tablets (1,250 mg total) by mouth 2 (two) times daily. 04/21/20 05/21/20  Pricilla Loveless, MD  levETIRAcetam (KEPPRA) 500 MG tablet Take 1 tablet (500 mg total) by mouth 2 (two) times daily. 04/21/20 05/21/20  Pricilla Loveless, MD  levothyroxine (SYNTHROID) 125 MCG tablet Take 0.5 tablets (62.5 mcg total) by mouth daily. Patient not taking: Reported on 04/21/2020 10/28/19 11/27/19  Maretta Bees, MD  Melatonin 10 MG CAPS Take 5 mg by mouth at bedtime as needed. Patient not taking: Reported on 04/21/2020 12/12/19   Sabino Donovan, MD  melatonin 5 MG TABS Take 5 mg by mouth at bedtime.    [provider]  OLANZapine zydis (ZYPREXA) 10 MG disintegrating tablet Take 1 tablet (10 mg total) by mouth every 8 (eight) hours as needed for up to 30 doses. Patient not taking: Reported on 04/21/2020 12/12/19   Sabino Donovan, MD  risperiDONE (RISPERDAL) 0.5 MG tablet Take 1 tablet (0.5 mg total) by mouth 2 (two) times daily. 10/28/19   Ghimire, Werner Lean, MD  risperiDONE (RISPERDAL) 0.5  MG tablet Take 1 tablet (0.5 mg total) by mouth 2 (two) times daily. Patient not taking: Reported on 04/21/2020 12/12/19 01/11/20  Sabino Donovan, MD  traZODone (DESYREL) 50 MG tablet Take 1 tablet (50 mg total) by mouth at bedtime. Patient not taking: Reported on 04/21/2020 12/12/19   Sabino Donovan, MD    Allergies    Penicillins  Review of Systems   Review of Systems  Constitutional: Negative for fever.  Cardiovascular: Negative for chest pain.  Gastrointestinal: Negative for abdominal pain.  Genitourinary: Positive for penile pain. Negative for difficulty urinating, discharge, dysuria, flank pain, penile swelling, scrotal  swelling, testicular pain and urgency.  Musculoskeletal: Negative for neck pain.    Physical Exam Updated Vital Signs BP (!) 108/46   Pulse (!) 58   Temp 98.6 F (37 C) (Oral)   Resp 17   SpO2 97%   Physical Exam Vitals and nursing note reviewed.  Constitutional:      General: He is not in acute distress.    Appearance: Normal appearance. He is not ill-appearing.  HENT:     Head: Normocephalic and atraumatic.  Eyes:     General: No scleral icterus.       Right eye: No discharge.        Left eye: No discharge.     Conjunctiva/sclera: Conjunctivae normal.  Pulmonary:     Effort: Pulmonary effort is normal.     Breath sounds: No stridor.  Genitourinary:    Testes: Normal.     Comments: Penis is healthy and normal appearing with no lesions, abrasions or evidence of trauma. Patient flinches with palpation of glans. Does not flinch with palpation while distracting patient with questions.  Testes with normal lie and intact cremasteric reflex Neurological:     Mental Status: He is alert and oriented to person, place, and time. Mental status is at baseline.     ED Results / Procedures / Treatments   Labs (all labs ordered are listed, but only abnormal results are displayed) Labs Reviewed  URINALYSIS, ROUTINE W REFLEX MICROSCOPIC - Abnormal; Notable for the following components:      Result Value   Ketones, ur 5 (*)    All other components within normal limits  GC/CHLAMYDIA PROBE AMP ([place]) NOT AT [facility]    EKG None  Radiology No results found.  Procedures Procedures (including critical care time)  Medications Ordered in ED Medications - No data to display  ED Course  I have reviewed the triage vital signs and the nursing notes.  Pertinent labs & imaging results that were available during my care of the patient were reviewed by me and considered in my medical decision making (see chart for details).    MDM Rules/Calculators/A&P                           Patient is 19 year old male with past medical history detailed above presented today for penis pain after he fell today during an episode of seizure.  He has been seen numerous times for multiple complaints over the past few days. He has also been seen by neurology and there is a high suspicion for some of his seizures being nonepileptic seizures although he also does have a history of epilepsy.  He is well-appearing on my examination today physical exam without any acute abnormalities.  Penis and testes are well-appearing without any lesions or abnormalities.  No discharge from patient is not sexually  active.  We will test for GC and chlamydia.  Urinalysis without any abnormalities.  6pm discussed with Aram Beecham his legal guardian.  She is concerned that patient is not having a group home and is making up complaints in order to be seen in the ER to avoid being in the group home.  Urinalysis without hematuria or any evidence of infection no acute abnormality.  No indication for follow-up with urology.  We will discharge with return precautions.  Addendum: [facility] type patient had seizure-like activity.  I immediately assessed patient at bedside he is not postictal and he is able to answer questions appropriately follow commands he has no tongue injury.  I have very low suspicion for this being a epileptic seizure.  He states he has not taken his medications today will provide him with home medications prior to discharge.  Final Clinical Impression(s) / ED Diagnoses Final diagnoses:  Penile pain    Rx / DC Orders ED Discharge Orders    None       Gailen Shelter, [place] 04/23/20 2017      Gailen Shelter, [place] 04/23/20 2046    Sabino Donovan, MD 04/23/20 2328

## 2020-04-23 NOTE — ED Notes (Signed)
[facility] called, transport arranged.

## 2020-04-27 ENCOUNTER — Ambulatory Visit (HOSPITAL_COMMUNITY): Payer: Self-pay

## 2020-05-12 ENCOUNTER — Encounter: Payer: Medicaid Other | Admitting: Family Medicine

## 2020-07-08 ENCOUNTER — Ambulatory Visit: Payer: Medicaid Other | Admitting: Neurology

## 2020-07-08 ENCOUNTER — Encounter: Payer: Self-pay | Admitting: Neurology

## 2020-07-08 VITALS — BP 118/68 | HR 68 | Ht 64.0 in | Wt 198.5 lb

## 2020-07-08 DIAGNOSIS — Z9889 Other specified postprocedural states: Secondary | ICD-10-CM

## 2020-07-08 DIAGNOSIS — G40209 Localization-related (focal) (partial) symptomatic epilepsy and epileptic syndromes with complex partial seizures, not intractable, without status epilepticus: Secondary | ICD-10-CM

## 2020-07-08 MED ORDER — LEVETIRACETAM 500 MG PO TABS: 1000.0000 mg | ORAL_TABLET | Freq: Two times a day (BID) | ORAL | 4 refills | Status: AC

## 2020-07-08 MED ORDER — DIVALPROEX SODIUM 500 MG PO DR TAB: DELAYED_RELEASE_TABLET | ORAL | 4 refills | Status: AC

## 2020-07-08 NOTE — Progress Notes (Signed)
Chief Complaint  Patient presents with  . New Patient (Initial Visit)    He is here to establish neurological care closer to his new group home. He is a ward of the state and here today with his social worker, Arnetha Courser. He resides at [facility]. Previously seen by Dr. Sherlean Foot. He was ED on 06/21/20 w/ seizure activity.     HISTORICAL  David [name] is a 20 year old male, seen in request by his primary care physician for evaluation of seizure, he is currently a group home resident, is accompanied by his social worker state guardian Aram Beecham stops at today's visit on July 08, 2020.   I reviewed and summarized the referring note. Aram Beecham has been his guardian since 2020, had a history of craniotomy for brain tumor in 2009, he lived with his mother in the past, but because of behavior issues, he ended up in jail, later state assumed his custody,  Had a history of seizure, when he was in shelter around 2021, has frequent seizures, not he is in a more stable environment, group home, he has much less seizure,  Last reported seizure event was in June 21, 2020, staring followed by whole body jerking, is not taking Depakote ER 250 mg 5 tablets 2 times a day, Keppra 500 mg 2 tablets twice a day, denies difficulty swallowing the pills,  He also has some behavior issues, taking Zyprexa 5 mg daily,  MRI of the brain result from [facility] system in March 2021, no acute abnormality, extensive encephalomalacia of left temporal lobe, with additional cortical atrophy of the left frontal and left parietal lobe.    REVIEW OF SYSTEMS: Full 14 system review of systems performed and notable only for as above All other review of systems were negative.  ALLERGIES: Allergies  Allergen Reactions  . Penicillins Hives    DID THE REACTION INVOLVE: Swelling of the face/tongue/throat, SOB, or low BP? Yes Sudden or severe rash/hives, skin peeling, or the inside of the mouth or nose?  No Did it require medical treatment? No When did it last happen? Was an infant If all above answers are "NO", may proceed with cephalosporin use.    HOME MEDICATIONS: Current Outpatient Medications  Medication Sig Dispense Refill  . cholecalciferol (VITAMIN D3) 25 MCG (1000 UNIT) tablet Take 1 tablet (1,000 Units total) by mouth daily. 30 tablet 0  . levothyroxine (SYNTHROID) 125 MCG tablet Take 125 mcg by mouth daily before breakfast.    . melatonin 5 MG TABS Take 5 mg by mouth at bedtime.    Marland Kitchen OLANZapine (ZYPREXA) 5 MG tablet Take 5 mg by mouth daily as needed (agitation).    . risperiDONE (RISPERDAL) 0.5 MG tablet Take 0.5 mg by mouth 2 (two) times daily.    . divalproex (DEPAKOTE ER) 250 MG 24 hr tablet Take 5 tablets (1,250 mg total) by mouth 2 (two) times daily. 300 tablet 0  . levETIRAcetam (KEPPRA) 500 MG tablet Take 1 tablet (500 mg total) by mouth 2 (two) times daily. 60 tablet 0   No current facility-administered medications for this visit.    PAST MEDICAL HISTORY: Past Medical History:  Diagnosis Date  . Autism   . Intellectual disability    Moderate  . Limbic encephalitis   . Seizures (HCC)   . Speech delay   . Thyroid disease     PAST SURGICAL HISTORY: Past Surgical History:  Procedure Laterality Date  . BRAIN SURGERY     Brain biopsy  .  GASTROSTOMY TUBE REVISION      FAMILY HISTORY: Family History  Problem Relation Age of Onset  . Other Mother        unknown history  . Other Father        unknown history    SOCIAL HISTORY: Social History   Socioeconomic History  . Marital status: Single    Spouse name: Not on file  . Number of children: 0  . Years of education: 18  . Highest education level: Not on file  Occupational History  . Occupation: Disabled  Tobacco Use  . Smoking status: Passive Smoke Exposure - Never Smoker  . Smokeless tobacco: Never Used  Substance and Sexual Activity  . Alcohol use: No  . Drug use: No  . Sexual activity:  Never  Other Topics Concern  . Not on file  Social History Narrative   Pt has a Child psychotherapist and guardian -- Arnetha Courser [phone].   Right-handed.   Lives at [facility] [phone].   One soda per day.   Social Determinants of Health   Financial Resource Strain: Not on file  Food Insecurity: Not on file  Transportation Needs: Not on file  Physical Activity: Not on file  Stress: Not on file  Social Connections: Not on file  Intimate Partner Violence: Not on file     PHYSICAL EXAM   Vitals:   07/08/20 1002  BP: 118/68  Pulse: 68  Weight: 198 lb 8 oz (90 kg)  Height: 5\' 4"  (1.626 m)   Not recorded     Body mass index is 34.07 kg/m.  PHYSICAL EXAMNIATION:  Gen: NAD, conversant, well nourised, well groomed                     Cardiovascular: Regular rate rhythm, no peripheral edema, warm, nontender. Eyes: Conjunctivae clear without exudates or hemorrhage Neck: Supple, no carotid bruits. Pulmonary: Clear to auscultation bilaterally   NEUROLOGICAL EXAM:  MENTAL STATUS: Speech/cognition: Awake, alert, tends to be more commands,   CRANIAL NERVES: CN II: Visual fields are full to confrontation. Pupils are round equal and briskly reactive to light. CN III, IV, VI: extraocular movement are normal. No ptosis. CN V: Facial sensation is intact to light touch CN VII: Face is symmetric with normal eye closure  CN VIII: Hearing is normal to causal conversation. CN IX, X: Phonation is normal. CN XI: Head turning and shoulder shrug are intact  MOTOR: Moving 4 extremities without difficulties REFLEXES: Reflexes are 2+ and symmetric at the biceps, triceps, knees, and ankles. Plantar responses are flexor.  SENSORY: Withdrawal to pain  COORDINATION: There is no trunk or limb dysmetria noted.  GAIT/STANCE: Gait is steady   DIAGNOSTIC DATA (LABS, IMAGING, TESTING) - I reviewed patient records, labs, notes, testing and imaging myself  where available.   ASSESSMENT AND PLAN  David [name] is a 20 y.o. male   History of brain tumor, status post left temporal lobectomy  Complex partial seizure with secondary Generalization  MRI brain without contrast in March 2021 from [facility] system showed extensive left temporal lobe encephalomalacia, cortical atrophy of left frontal and left parietal lobe,  Keep current dose of Keppra 500 mg 2 tablets twice a day, Depakote changed to DR 500 mg 2 and half tablets twice a day  Repeat EEG  [facility], M.D. Ph.D.  [facility] [address], [address] [place], [place] Ph: [phone] Fax: [phone]  CC:  [phone],  Lorin Picket, MD [address] [place],  [place] [address]  Dairl Ponder, NP

## 2020-08-02 ENCOUNTER — Ambulatory Visit: Payer: Medicaid Other | Admitting: Neurology

## 2020-08-02 ENCOUNTER — Other Ambulatory Visit: Payer: Self-pay

## 2020-08-02 DIAGNOSIS — Z9889 Other specified postprocedural states: Secondary | ICD-10-CM

## 2020-08-02 DIAGNOSIS — G40209 Localization-related (focal) (partial) symptomatic epilepsy and epileptic syndromes with complex partial seizures, not intractable, without status epilepticus: Secondary | ICD-10-CM

## 2020-08-03 NOTE — Progress Notes (Signed)
EEG report is under procedure

## 2020-08-04 ENCOUNTER — Telehealth: Payer: Self-pay | Admitting: Neurology

## 2020-08-04 NOTE — Telephone Encounter (Signed)
Tammy from [facility] called to ask for clarity on the paperwork that was faxed over. She states she is unsure of what documentation they have.  Best contact: [phone]

## 2020-08-04 NOTE — Telephone Encounter (Signed)
Our office did not place a home health referral for this patient. I called Tammy back at [facility] who states the referral was sent by Dairl Ponder, NP. We have him listed at the patient's PCP. Tammy will contact him for clarification of orders.

## 2020-08-13 NOTE — Procedures (Signed)
   HISTORY: 20 year old male, presented with seizure  TECHNIQUE:  This is a routine 16 channel EEG recording with one channel devoted to a limited EKG recording.  It was performed during wakefulness, drowsiness and asleep.  Hyperventilation and photic stimulation were performed as activating procedures.  There are minimum muscle and movement artifact noted.  Upon maximum arousal, posterior dominant waking rhythm consistent of rhythmic alpha range activity, with frequency of 8 hz. Activities are symmetric over the bilateral posterior derivations and attenuated with eye opening.  Hyperventilation produced mild/moderate buildup with higher amplitude and the slower activities noted.  Photic stimulation did not alter the tracing.  During EEG recording, patient developed drowsiness and no deeper stage of sleep was achieved During EEG recording, there was no epileptiform discharge noted.  EKG demonstrate sinus rhythm, with heart rate of 76 bpm  CONCLUSION: This is a  normal awake EEG.  There is no electrodiagnostic evidence of epileptiform discharge.  Levert Feinstein, M.D. Ph.D.  [facility] [address] [place], [place] [address] Phone: [phone] Fax:      [phone]

## 2020-10-05 ENCOUNTER — Encounter: Payer: Self-pay | Admitting: Neurology

## 2020-10-05 ENCOUNTER — Ambulatory Visit: Payer: Medicaid Other | Admitting: Neurology

## 2020-10-05 VITALS — BP 128/76 | HR 73 | Ht 64.0 in | Wt 214.0 lb

## 2020-10-05 DIAGNOSIS — G40209 Localization-related (focal) (partial) symptomatic epilepsy and epileptic syndromes with complex partial seizures, not intractable, without status epilepticus: Secondary | ICD-10-CM | POA: Diagnosis not present

## 2020-10-05 NOTE — Progress Notes (Signed)
Chief Complaint  Patient presents with  . Follow-up    Pt rm 15, [place]  DSS case worker, he is in a adult care faciality, on May 1 it was reported the pt had back to back 3-4 mini seizures. First time that happened in a while.     HISTORICAL  David [name] is a 20 year old male, seen in request by his primary care physician for evaluation of seizure, he is currently a group home resident, is accompanied by his social worker state guardian David [name] stops at today's visit on July 08, 2020.  I reviewed and summarized the referring note. David [name] has been his guardian since 2020, had a history of craniotomy for brain tumor in 2009, he lived with his mother in the past, but because of behavior issues, he ended up in jail, later state assumed his custody,  Had a history of seizure, when he was in shelter around 2021, has frequent seizures, not he is in a more stable environment, group home, he has much less seizure,  Last reported seizure event was in June 21, 2020, staring followed by whole body jerking, is not taking Depakote ER 250 mg 5 tablets 2 times a day, Keppra 500 mg 2 tablets twice a day, denies difficulty swallowing the pills,  He also has some behavior issues, taking Zyprexa 5 mg daily,  MRI of the brain result from [facility] in March 2021, no acute abnormality, extensive encephalomalacia of left temporal lobe, with additional cortical atrophy of the left frontal and left parietal lobe.  Update Oct 05, 2020 SS: Here today with David [name] his DSS worker, he is ward of the state, lives in adult care facility.  In the ER 09/26/20,  For possible seizure event, at the time having behavioral issues, related to being upset about staff members leaving. Before spell reports tingling to his left jaw, which can be typical, then reported his eyes rolled back, had shaking of extremities, motion for staff to sit him on the floor, this happened 2 more times.  There was no oral injury or  urinary incontinence.  There was no missed doses of medication reportedly.   Depakote level was elevated 146, CBC showed WBC 3.9, BMP unremarkable, creatinine 0.93.  EEG in March 2022 was normal.  David [name] is working on getting him psychiatric resources for autism, difficulty finding a therapist who accepts his insurance.  His seizures have been controlled up until this episode.  At last visit, continue Keppra 1000 mg twice daily, Depakote changed to DR 500 mg, 2.5 tablets twice daily.   REVIEW OF SYSTEMS: Full 14 system review of systems performed and notable only for as above  See HPI  ALLERGIES: Allergies  Allergen Reactions  . Penicillins Hives    DID THE REACTION INVOLVE: Swelling of the face/tongue/throat, SOB, or low BP? Yes Sudden or severe rash/hives, skin peeling, or the inside of the mouth or nose? No Did it require medical treatment? No When did it last happen? Was an infant If all above answers are "NO", may proceed with cephalosporin use.    HOME MEDICATIONS: Current Outpatient Medications  Medication Sig Dispense Refill  . cholecalciferol (VITAMIN D3) 25 MCG (1000 UNIT) tablet Take 1 tablet (1,000 Units total) by mouth daily. 30 tablet 0  . divalproex (DEPAKOTE) 500 MG DR tablet 2 + 1/2 tab bid 460 tablet 4  . levETIRAcetam (KEPPRA) 500 MG tablet Take 2 tablets (1,000 mg total) by mouth 2 (two) times daily. 370 tablet 4  .  levothyroxine (SYNTHROID) 125 MCG tablet Take 125 mcg by mouth daily before breakfast.    . melatonin 5 MG TABS Take 5 mg by mouth at bedtime.    Marland Kitchen OLANZapine (ZYPREXA) 5 MG tablet Take 5 mg by mouth daily as needed (agitation).    . risperiDONE (RISPERDAL) 0.5 MG tablet Take 0.5 mg by mouth 2 (two) times daily.    . divalproex (DEPAKOTE ER) 250 MG 24 hr tablet Take 5 tablets (1,250 mg total) by mouth 2 (two) times daily. 300 tablet 0   No current facility-administered medications for this visit.    PAST MEDICAL HISTORY: Past Medical History:   Diagnosis Date  . Autism   . Intellectual disability    Moderate  . Limbic encephalitis   . Seizures (HCC)   . Speech delay   . Thyroid disease     PAST SURGICAL HISTORY: Past Surgical History:  Procedure Laterality Date  . BRAIN SURGERY     Brain biopsy  . GASTROSTOMY TUBE REVISION      FAMILY HISTORY: Family History  Problem Relation Age of Onset  . Other Mother        unknown history  . Other Father        unknown history    SOCIAL HISTORY: Social History   Socioeconomic History  . Marital status: Single    Spouse name: Not on file  . Number of children: 0  . Years of education: 41  . Highest education level: Not on file  Occupational History  . Occupation: Disabled  Tobacco Use  . Smoking status: Passive Smoke Exposure - Never Smoker  . Smokeless tobacco: Never Used  Substance and Sexual Activity  . Alcohol use: No  . Drug use: No  . Sexual activity: Never  Other Topics Concern  . Not on file  Social History Narrative   Pt has a Child psychotherapist and guardian -- David [name] [phone].   Right-handed.   Lives at [facility] [phone].   One soda per day.   Social Determinants of Health   Financial Resource Strain: Not on file  Food Insecurity: Not on file  Transportation Needs: Not on file  Physical Activity: Not on file  Stress: Not on file  Social Connections: Not on file  Intimate Partner Violence: Not on file   PHYSICAL EXAM   Vitals:   10/05/20 1352  BP: 128/76  Pulse: 73  Weight: 214 lb (97.1 kg)  Height: 5\' 4"  (1.626 m)   Not recorded     Body mass index is 36.73 kg/m.  PHYSICAL EXAMNIATION:  Gen: NAD, history is mostly provided by , he responds when prompted, well nourised, well groomed                     Cardiovascular: Regular rate rhythm, no peripheral edema, warm, nontender. Eyes: Conjunctivae clear without exudates or hemorrhage Pulmonary: Clear to auscultation bilaterally    NEUROLOGICAL EXAM:  MENTAL STATUS: Speech/cognition: Awake, alert, looking at his phone    CRANIAL NERVES: CN II: Visual fields are full to confrontation. Pupils are round equal and briskly reactive to light. CN III, IV, VI: extraocular movement are normal. No ptosis. CN V: Facial sensation is intact to light touch CN VII: Face is symmetric with normal eye closure  CN VIII: Hearing is normal to causal conversation. CN IX, X: Phonation is normal. CN XI: Head turning and shoulder shrug are intact  MOTOR: Good strength all extremities  REFLEXES:  Reflexes are 2+ throughout  SENSORY: Intact to all extremities  COORDINATION: Finger-nose-finger and heel-to-shin is normal bilaterally  GAIT/STANCE: Gait is normal  DIAGNOSTIC DATA (LABS, IMAGING, TESTING) - I reviewed patient records, labs, notes, testing and imaging myself where available.   ASSESSMENT AND PLAN  David [name] is a 20 y.o. male   1. History of brain tumor, status post left temporal lobectomy  2. Complex partial seizure with secondary Generalization  -recent seizure spells 09/26/20, question if behavioral aspect, brought on by emotions, Depakote level was significantly elevated 146 -Recheck Depakote, CMP to check liver function, ammonia level -Need to verify with adult care facility he is not taking both Depakote ER and DR formulations with elevated blood level -For now, continue Keppra 500 mg, 2 tablets twice a day, Depakote DR 500 mg 2.5 tablets twice a day -EEG was normal in March 2022 -Follow-up in 6 months or sooner if needed, call for seizures  I spent 30 minutes of face-to-face and non-face-to-face time with patient.  This included previsit chart review, discussing seizure spells both recent and previous, behavorial symptoms, any triggering factor, medications, and lab work.   Otila Kluver, DNP  [facility] [address], [address] [place], [place] [address] [phone]

## 2020-10-05 NOTE — Patient Instructions (Signed)
Check labs today  Need to verify medications  Call for seizures See you back in 6 months

## 2020-10-06 ENCOUNTER — Telehealth: Payer: Self-pay | Admitting: Neurology

## 2020-10-06 ENCOUNTER — Encounter: Payer: Self-pay | Admitting: Neurology

## 2020-10-06 LAB — COMPREHENSIVE METABOLIC PANEL
ALT: 12 IU/L (ref 0–44)
AST: 22 IU/L (ref 0–40)
Albumin/Globulin Ratio: 1.2 (ref 1.2–2.2)
Albumin: 4.4 g/dL (ref 4.1–5.2)
Alkaline Phosphatase: 66 IU/L (ref 51–125)
BUN/Creatinine Ratio: 14 (ref 9–20)
BUN: 14 mg/dL (ref 6–20)
Bilirubin Total: <0.2 mg/dL (ref 0.0–1.2)
CO2: 25 mmol/L (ref 20–29)
Calcium: 10.3 mg/dL — ABNORMAL HIGH (ref 8.7–10.2)
Chloride: 97 mmol/L (ref 96–106)
Creatinine, Ser: 1.03 mg/dL (ref 0.76–1.27)
Globulin, Total: 3.6 g/dL (ref 1.5–4.5)
Glucose: 95 mg/dL (ref 65–99)
Potassium: 4.3 mmol/L (ref 3.5–5.2)
Sodium: 139 mmol/L (ref 134–144)
Total Protein: 8 g/dL (ref 6.0–8.5)
eGFR: 107 mL/min/{1.73_m2} (ref >59)

## 2020-10-06 LAB — VALPROIC ACID LEVEL: Valproic Acid Lvl: 154 ug/mL (ref 50–100)

## 2020-10-06 LAB — AMMONIA: Ammonia: 40 ug/dL (ref 36–136)

## 2020-10-06 NOTE — Telephone Encounter (Signed)
[place] [employer] David [name], guardian) called, you can call the facility concerning to confirm access to medication.   My contact info: Cell [phone]

## 2020-10-06 NOTE — Telephone Encounter (Signed)
I called and spoke to [place] at [facility] where pt resides.  He is taking from last seen with Dr. Terrace Arabia  07-08-2020 (2) 500mg  tablets and  (1) 250mg  tablets po bid. 1250mg  po bid.   Went over other list of medications.

## 2020-10-06 NOTE — Telephone Encounter (Signed)
Called and spoke to Aram Beecham) legal guardian.  VPA level still elevated 154 (more then 09-26-20 level).  Pt stays at [facility].  She gave me # to call [phone] (but was fax).  I did fax generic to call me about pts labs and meds.  I tried [phone] full, not accepting messages.  Fax was received (confirmation).  I called Aram Beecham back and rellayed could not reach anyone.  I did fax hopefully would reach them.  She will message them as well.

## 2020-10-06 NOTE — Telephone Encounter (Signed)
Depakote level has returned quite elevated, 154.  Please call his adult care home, I am concerned he may be taking Depakote DR and ER preparation.  At his last visit with Dr. Terrace Arabia, the Depakote ER was to be discontinued, switched to DR.

## 2020-10-06 NOTE — Telephone Encounter (Signed)
Ran this by Dr.Yan, even tho level is elevated, he is not toxic on history or exam. Not clear recent events were true seizure, a lot of behavior issues at the time. Will continue current medications for now at same doses of Keppra and Depakote. Ammonia is pending.

## 2020-10-07 NOTE — Telephone Encounter (Signed)
Fax confirmation to [facility] [phone] and medical records fax [phone]. (pcp was given by [facility])., and also [facility] received relating to labs faxed.

## 2020-10-07 NOTE — Telephone Encounter (Signed)
-----   Message from Glean Salvo, NP sent at 10/07/2020  5:54 AM EDT ----- Please send labs over to facility, and PCP.  For now, we will continue current seizure medications, if there are any further seizure episodes, they are to let us know.

## 2020-11-01 DIAGNOSIS — F4325 Adjustment disorder with mixed disturbance of emotions and conduct: Secondary | ICD-10-CM | POA: Diagnosis not present

## 2020-11-01 DIAGNOSIS — F840 Autistic disorder: Secondary | ICD-10-CM | POA: Diagnosis not present

## 2020-11-23 DIAGNOSIS — F09 Unspecified mental disorder due to known physiological condition: Secondary | ICD-10-CM | POA: Diagnosis not present

## 2020-11-23 DIAGNOSIS — R451 Restlessness and agitation: Secondary | ICD-10-CM | POA: Diagnosis not present

## 2020-11-23 DIAGNOSIS — G4089 Other seizures: Secondary | ICD-10-CM | POA: Diagnosis not present

## 2020-11-30 DIAGNOSIS — F4325 Adjustment disorder with mixed disturbance of emotions and conduct: Secondary | ICD-10-CM | POA: Diagnosis not present

## 2020-11-30 DIAGNOSIS — F840 Autistic disorder: Secondary | ICD-10-CM | POA: Diagnosis not present

## 2020-11-30 IMAGING — DX DG SHOULDER 2+V*R*
3 series · 3 of 3 positions shown · non-contrast
Comparison: None.

CLINICAL DATA: Pain. Seizure.

EXAM:
RIGHT SHOULDER - 2+ VIEW

[shoulder grashey]
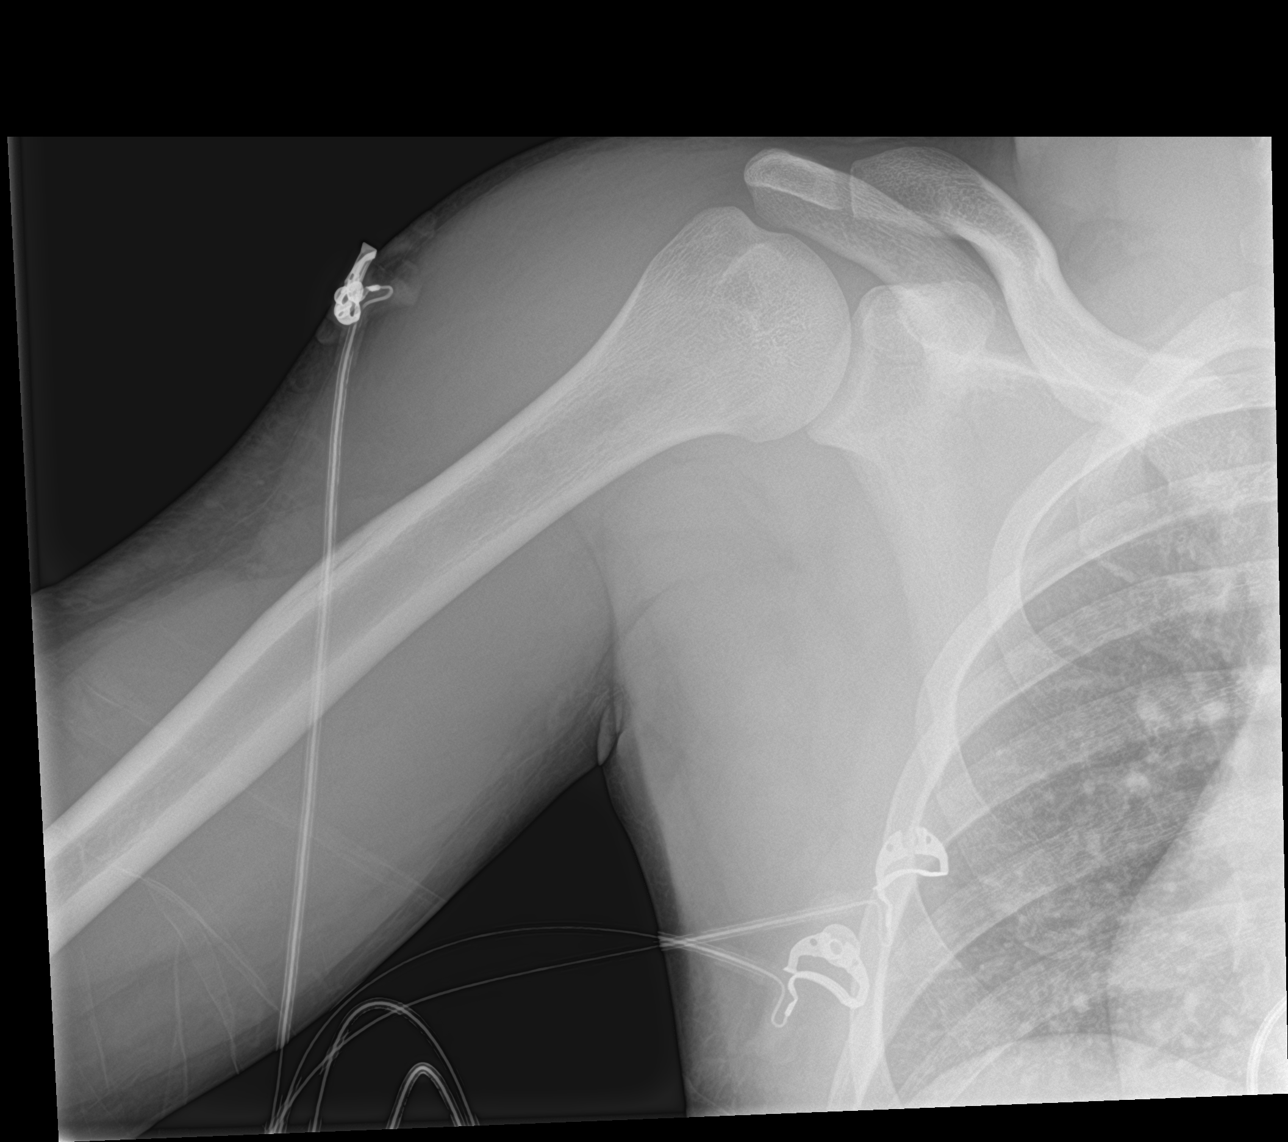

[shoulder y view]
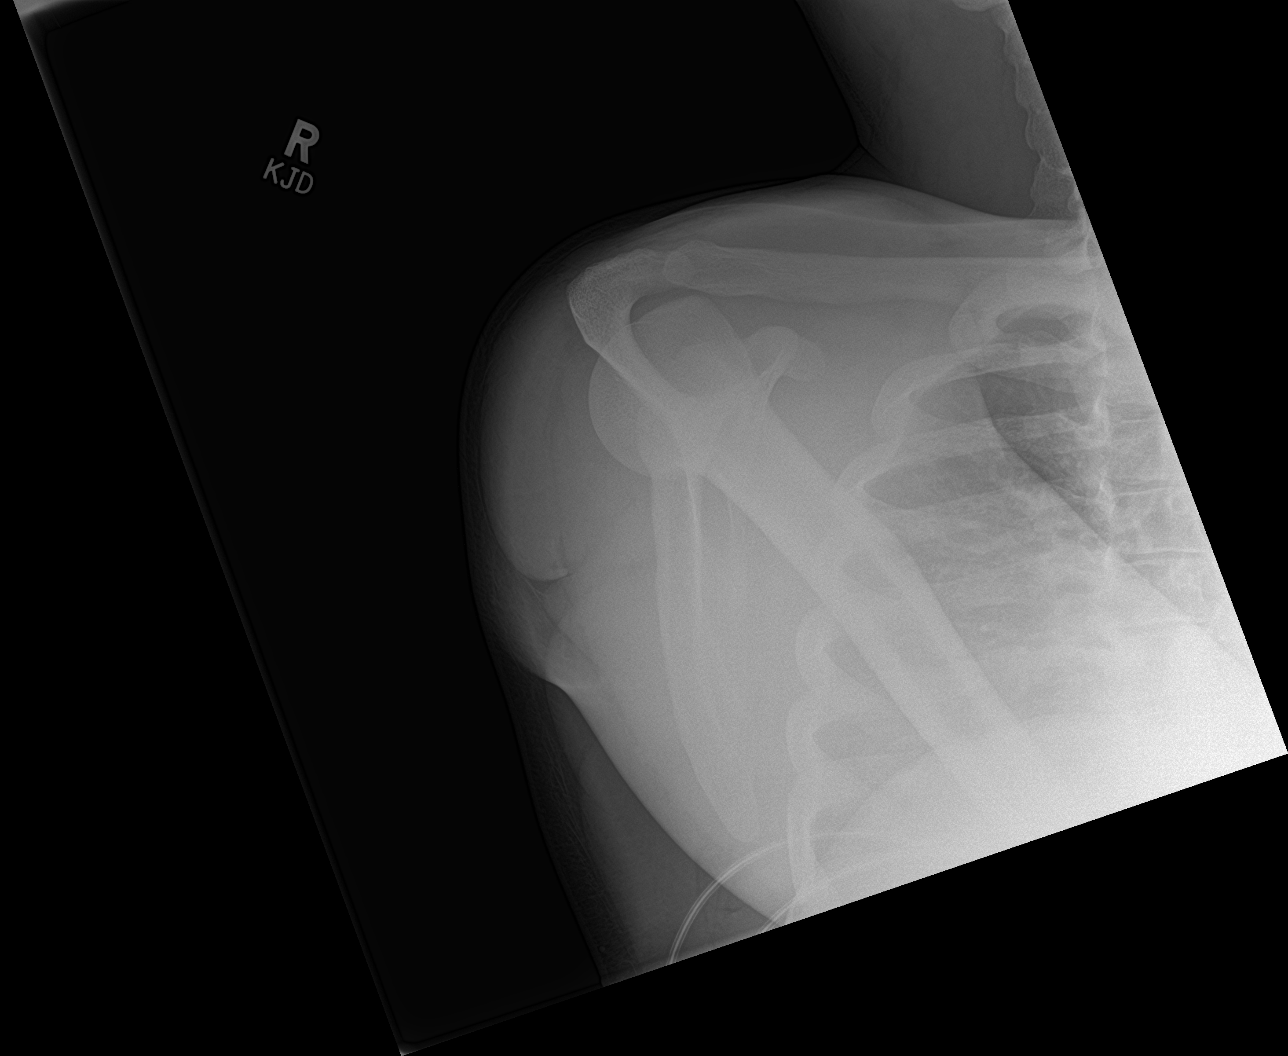

[shoulder ap neutral]
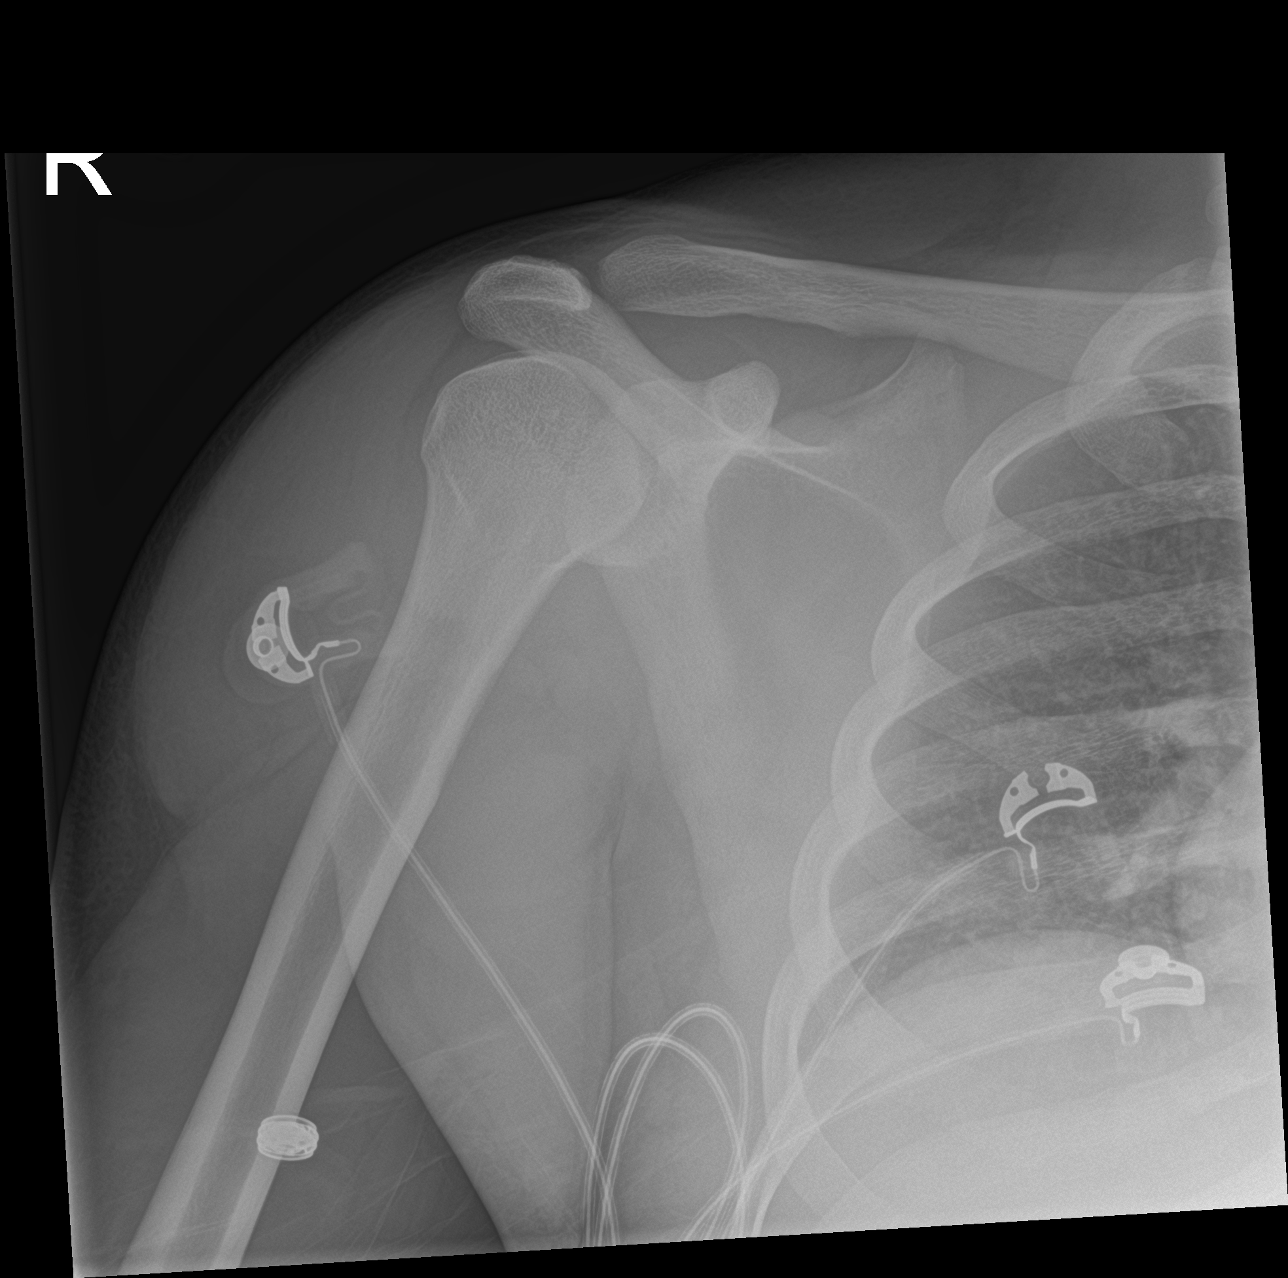

[3 of 3 positions shown; findings below may reference images not displayed]

FINDINGS: There is no evidence of fracture or dislocation. Humeral head is
normally located on the transscapular Y-view. Soft tissue edema is
noted superior and laterally. Included ribs are intact.
IMPRESSION: Soft tissue edema without acute osseous abnormality.

## 2020-12-15 DIAGNOSIS — H10423 Simple chronic conjunctivitis, bilateral: Secondary | ICD-10-CM | POA: Diagnosis not present

## 2020-12-17 DIAGNOSIS — F09 Unspecified mental disorder due to known physiological condition: Secondary | ICD-10-CM | POA: Diagnosis not present

## 2020-12-17 DIAGNOSIS — R451 Restlessness and agitation: Secondary | ICD-10-CM | POA: Diagnosis not present

## 2020-12-17 DIAGNOSIS — G4089 Other seizures: Secondary | ICD-10-CM | POA: Diagnosis not present

## 2020-12-28 IMAGING — CT CT HEAD W/O CM
3 series · 15 of 47 positions shown, 18 images · non-contrast
Comparison: November 05, 2019

CLINICAL DATA: Seizure

EXAM:
CT HEAD WITHOUT CONTRAST
TECHNIQUE: Contiguous axial images were obtained from the base of the skull
through the vertex without intravenous contrast.

[Series 2: head wo · axial · 0.47mm/px · z∈[-147,-12]mm · 9 of 33 slices shown, 12 images]
[im 3/33  brain]
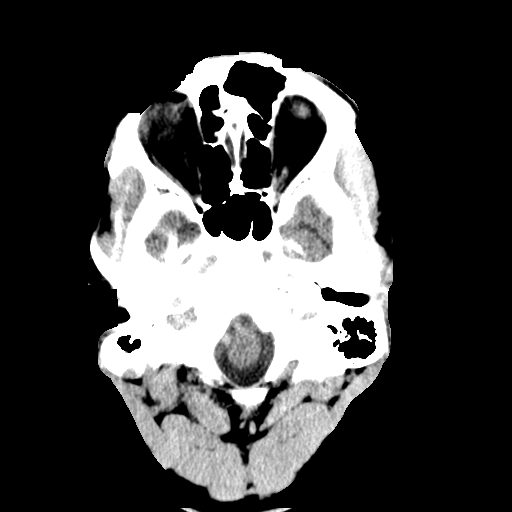
[im 3/33  bone]
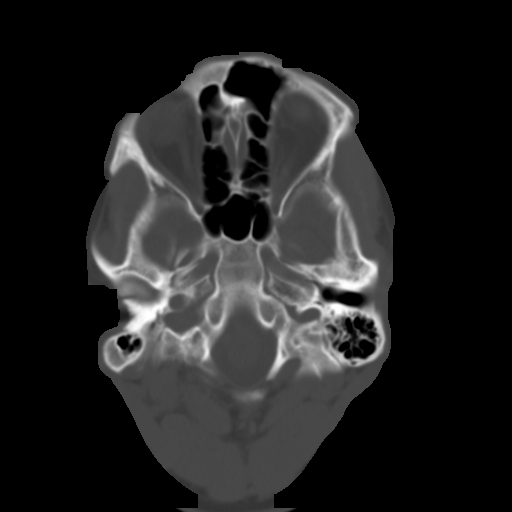
[im 6/33  brain]
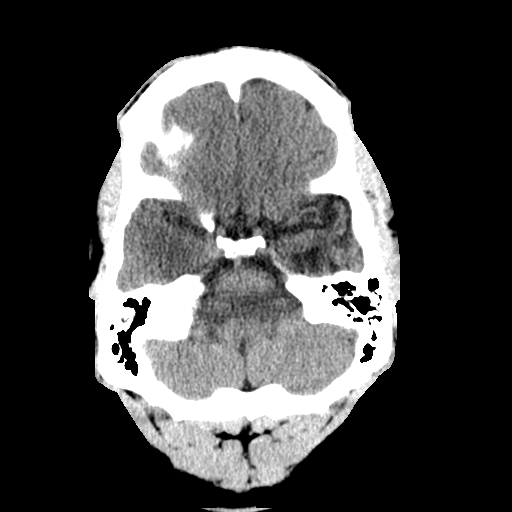
[im 9/33  brain]
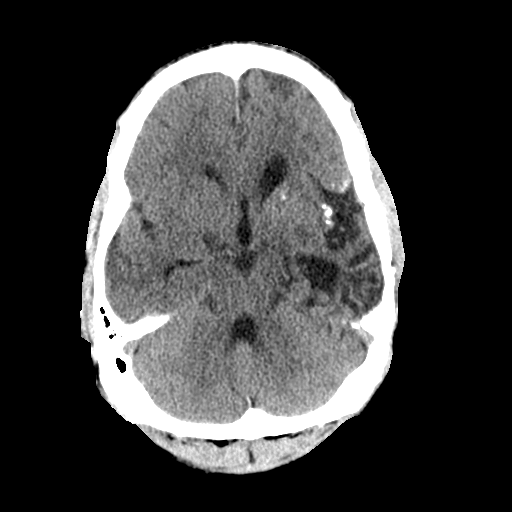
[im 13/33  brain]
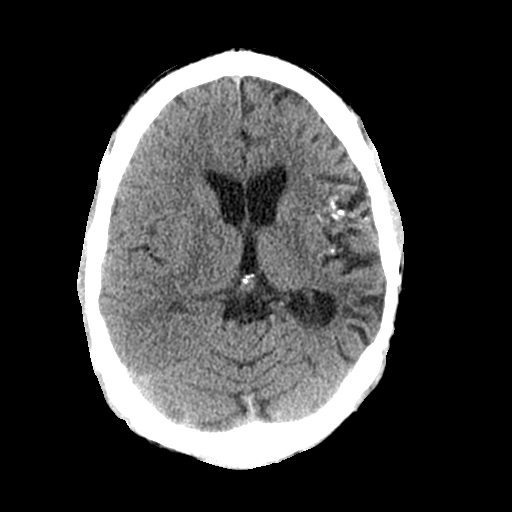
[im 17/33  brain]
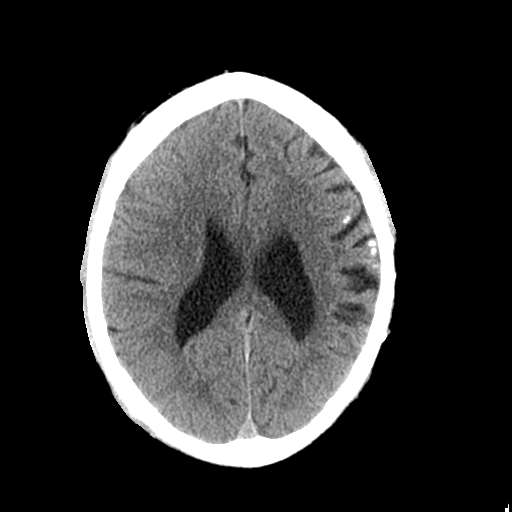
[im 17/33  bone]
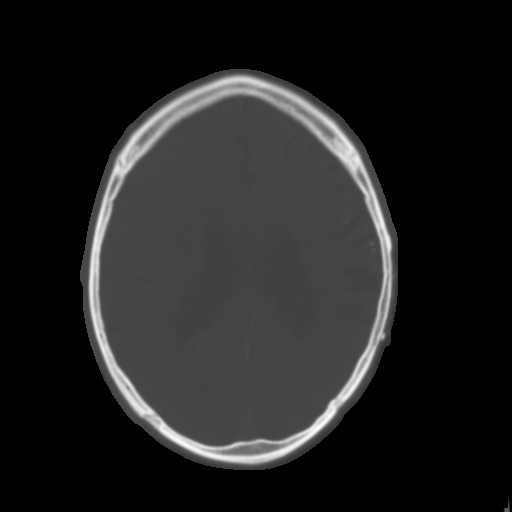
[im 20/33  brain]
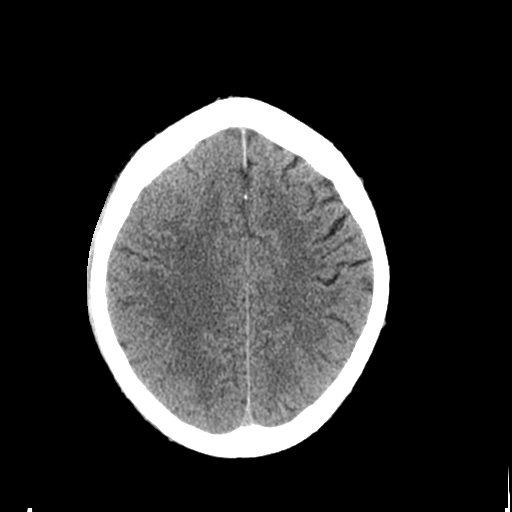
[im 24/33  brain]
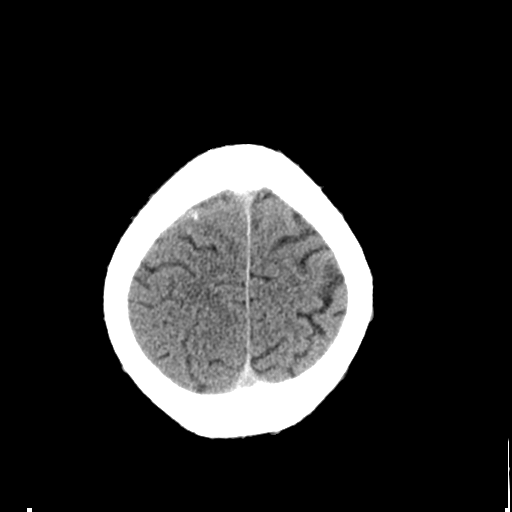
[im 27/33  brain]
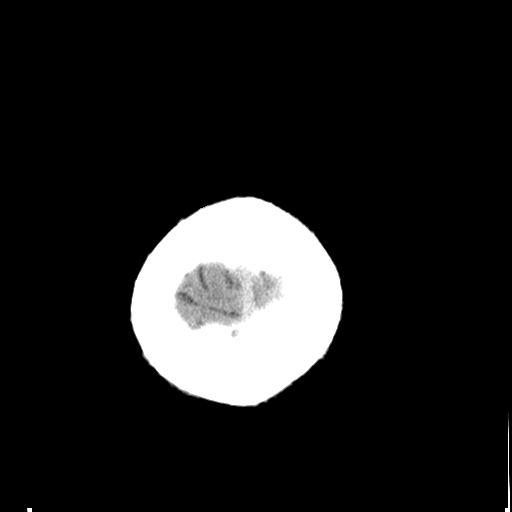
[im 30/33  brain]
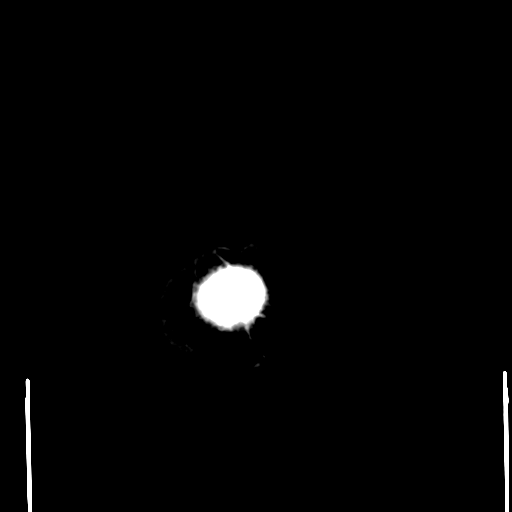
[im 30/33  bone]
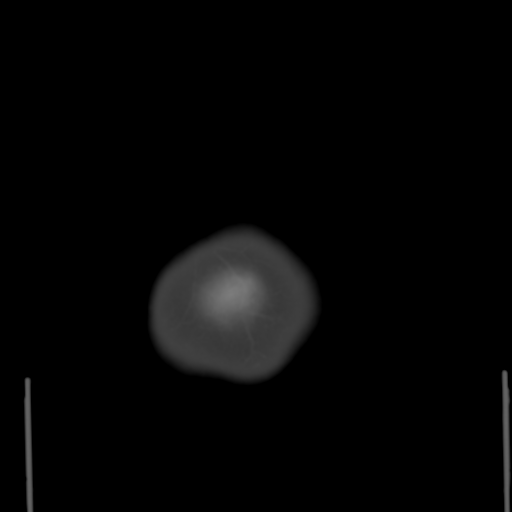

[Series 5: coronal soft tissue · coronal · 0.33mm/px · 3 of 74 slices shown]
[im 25/74  brain]
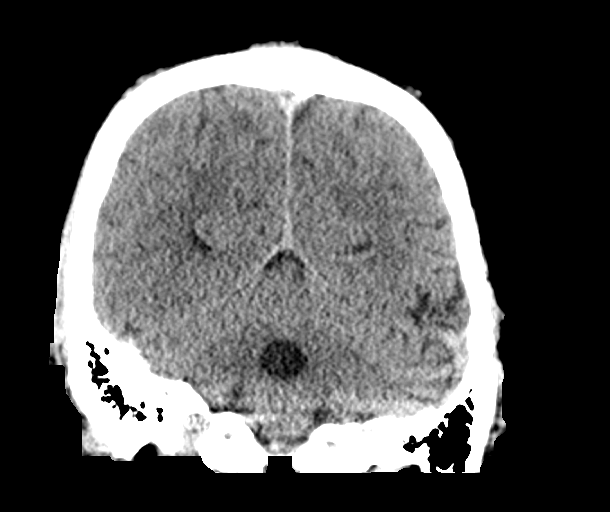
[im 33/74  brain]
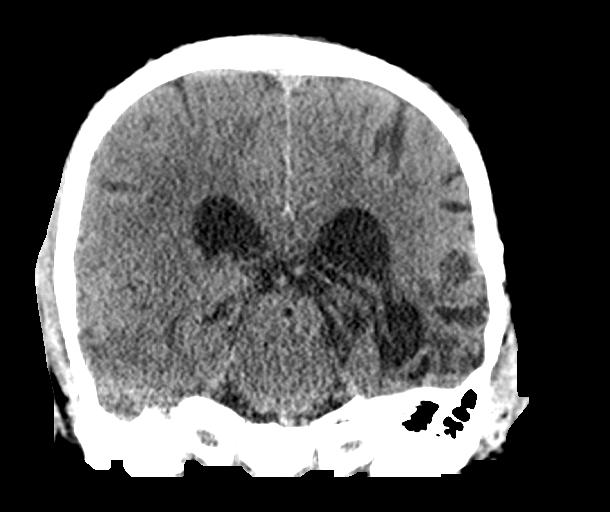
[im 41/74  brain]
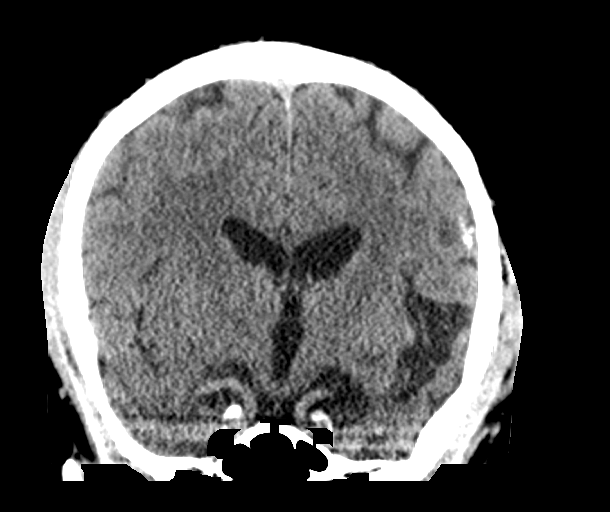

[Series 6: sagittal soft tissue · sagittal · 0.33mm/px · 3 of 67 slices shown]
[im 23/67  brain]
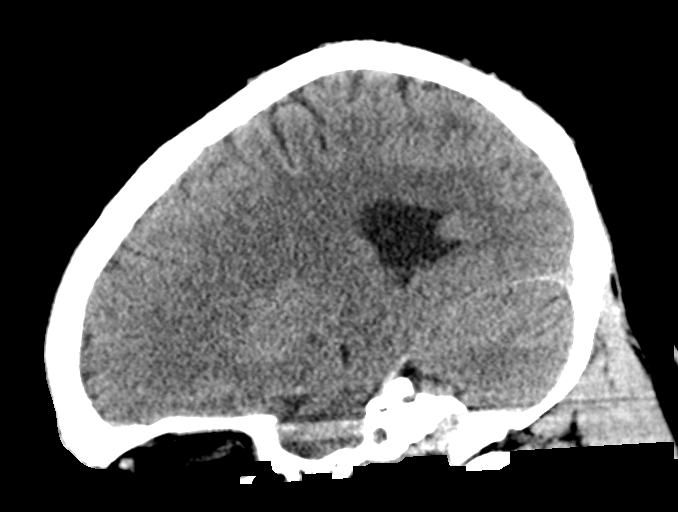
[im 34/67  brain]
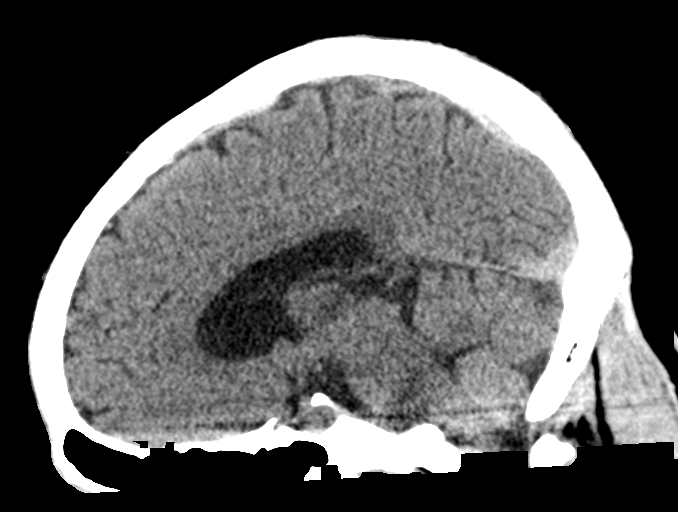
[im 45/67  brain]
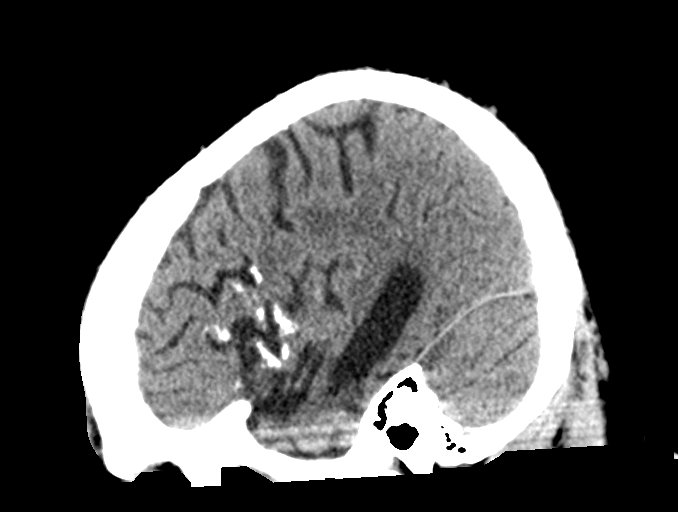

[15 of 47 positions shown; findings below may reference images not displayed]

FINDINGS: Brain: Ventricular prominence for age, particular on the left, is
stable compared to 2 days prior. The left hemispheric atrophy noted
previously with encephalomalacia primarily in the left temporal lobe
region as well as involvement of the head of the caudate nucleus
again noted. Multiple foci of calcifications in these areas are
stable. No focal intracranial mass, hemorrhage, extra-axial fluid
collection, or midline shift evident. No new foci of
encephalomalacia. No acute infarct evident.

Vascular: No hyperdense vessel.  No evident vascular calcification.

Skull: Previous craniotomy on the left appear stable. Bony calvarium
otherwise appears intact.

Sinuses/Orbits: Mild mucosal thickening noted in several ethmoid air
cells as well as mild mucosal thickening noted in the anterior
sphenoid sinus regions. Visualized orbits appear symmetric
bilaterally.

Other: Visualized mastoid air cells are clear.
IMPRESSION: Stable areas of encephalomalacia and calcification on the left with
ventricular prominence for age, stable. No mass, hemorrhage, or
acute appearing infarct evident. No new intracranial lesion. Prior
craniotomy on the left.

Foci of paranasal sinus disease as noted.

## 2021-01-13 ENCOUNTER — Other Ambulatory Visit: Payer: Self-pay

## 2021-01-13 ENCOUNTER — Ambulatory Visit (INDEPENDENT_AMBULATORY_CARE_PROVIDER_SITE_OTHER): Payer: Medicare Other | Admitting: Clinical

## 2021-01-13 DIAGNOSIS — F840 Autistic disorder: Secondary | ICD-10-CM

## 2021-01-13 DIAGNOSIS — F4324 Adjustment disorder with disturbance of conduct: Secondary | ICD-10-CM | POA: Diagnosis not present
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
## 2021-04-26 ENCOUNTER — Ambulatory Visit: Payer: Medicaid Other | Admitting: Neurology

## 2024-02-18 NOTE — Consults (Signed)
 [facility]  Neurology Consultation Note  Requesting Attending: Marylin Hussar, MD,Emergency Medicine Reason for Consultation: Concern for breakthrough seizures  Date of Service: 02/18/2024  History of Present Illness:  [name] is a 23 y.o. Male who presents for breakthrough seizures with PMH: anti-NMDA receptor encephalitis, left temporal lobe necrosis, focal epilepsy, GTC, conversion disorder, hypothyroidism, autism.   Neurology consulted for breakthrough seizures.   Patient was seen with parents at bedside who provided history. He was recently adopted from a group home. Parents have known patient for the past 6 years and have seen him have seizures on rare occasions. He came to stay with them on 9/5. His ASM meds were managed by the group home physicians and his medical care was transferred to a [facility] PCP post adoption.   Since 9/13, parents note several seizures, each lasting about 10 minutes. Each episode started as a tonic clonic seizure with jerking of all movements and then a post-ictal period of about 20-30 minutes. He would then return to baseline. Today, patient had one seizure episode lasting 20 minutes after which EMS was called. He was noted to have another seizure by EMS and two more events by ED. He received Versed 2.5 MG which broke the seizures.   Mom reported that he missed a day's worth of Keppra  and Lacosamide on 9/12. Seizures began on 9/13. On further inquiry, it appears that in the process of ASM transfer between providers, his Keppra  dosing was inadvertently decreased from Keppra  2,000 MG BID to Keppra  1,000 MG BID. Patient has been getting Keppra  1,000 MG BID from 9/13 - 9/22, essentially, half his normal dose.   Mom reports no recent illnesses other than a mild cold a few weeks ago.   Patient was post-ictal when initially seen but he awoke to voice and followed some commands. When seen with the entire team, he asked for food to eat, wanted to go  home and head to a restaurant. He was jovial and as per mom, back to baseline. He has a follow-up neurology outpatient appointment on 03/18/2024.   As per chart review, he was seen by Dr. True in 07/2019. 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 Seizure semiology was described as: 1) Generalized convulsions, which began in 2009.  This semiology continued for many years, and was the focus of event characterization during a November 2018 admission, which found no epileptiform correlate.   2) Focal onset, which began in Apr15.  At the beginning of these events, the patient will tell someone he is having it, points to his face, gets visibly scared.  The right side of his face will begin to jerk, followed by a staring period that lasts for several seconds.  Seizures first began in March 2009 characterized by a reduction in verbal output. He also began to have involuntary orofacial ad choreoathetotic movements, mild right hemiparesis, autonomic and breathing dysfunction. He presented to [facility] and was eventually transferred to [facility], where aggregated workup revealed a high IgG index, oligoclonal bands, left temporal cortical non-enhancing cystic lesions, and ultimately anti-NMDA receptor antibodies (with later confirmed intrathecal synthesis). Over the course of his care (which likely extended after his initial admission), he received at least two rounds of intravenous steroids, IVIG, and plasmapheresis.  Review of Systems:  10-point ROS negative except as per HPI.  Past Medical History:   Past Medical History:  Diagnosis  Date  . Autism (HHS-HCC)   . Development delay 2009  . History of encephalitis 07/2007   Workup conducted at [facility].  Notes unavailable, but most diagnostic studies available through [facility].  See Neurology Consult note from  28Mar21 for full details  . Pseudoseizures    Please see neurology consult note from 28Mar21 for summary history  . Seizure disorder (CMS/HHS-HCC)    Please see neurology consult note from 28Mar21 for summary history  . Thyroid  disease     Past Surgical History:   Past Surgical History:  Procedure Laterality Date  . CRANIOTOMY FOR TUMOR      Family History:   Family History  Problem Relation Name Age of Onset  . No Known Problems Mother    . No Known Problems Father       Social History:   Social History   Socioeconomic History  . Marital status: Single  Tobacco Use  . Smoking status: Never  . Smokeless tobacco: Never  Vaping Use  . Vaping status: Never Used  Substance and Sexual Activity  . Alcohol use: Not Currently    Alcohol/week: 0.0 standard drinks of alcohol  . Drug use: Not Currently  . Sexual activity: Not Currently  Social History Narrative   The patient lives in [facility] [place] [phone])   Social Drivers of Health   Financial Resource Strain: Low Risk  (03/10/2021)   [facility]   Overall Financial Resource Strain (CARDIA)   . Difficulty of Paying Living Expenses: Not hard at all  Food Insecurity: No Food Insecurity (03/10/2021)   Received from [facility]   Hunger Vital Sign   . Within the past 12 months, you worried that your food would run out before you got the money to buy more.: Never true   . Within the past 12 months, the food you bought just didn't last and you didn't have money to get more.: Never true  Transportation Needs: No Transportation Needs (03/10/2021)   Received from [facility] - Transportation   . Lack of Transportation (Medical): No   . Lack of Transportation (Non-Medical): No     Medications:   Prior to Admission Medications  Prescriptions Last Dose Taking?  OLANZapine  (ZYPREXA ) 10 MG tablet  No  Sig: Take 1 tablet (10 mg total) by mouth at bedtime   OXcarbazepine (TRILEPTAL) 300 MG tablet  No  Sig: Take 1 tablet (300 mg total) by mouth every 12 (twelve) hours  atovaquone-proguaniL (MALARONE) 250-100 mg tablet  No  Sig: Take 1 tablet by mouth once daily for 120 days Take for malaria prophylaxis. Begin 1-2 days before exposure and continue for a week after exposure has passed.  cetirizine (ZYRTEC) 10 MG tablet  No  Sig: Take 1 tablet (10 mg total) by mouth once daily  cholecalciferol  1000 unit tablet  No  Sig: Take 2 tablets (2,000 Units total) by mouth once daily  diazePAM (DIASTAT-ACUDIAL) kit  No  Sig: Place 5 mg rectally as needed for Seizures for up to 10 doses Dose may be repeated in 4-12 hours if needed.  Do not use for more than 5 episodes per month or more than one episode every 5 days.  Patient not taking: Reported on 02/01/2024  divalproex  (DEPAKOTE  ER) 250 MG ER tablet  No  Sig: Take 2 tablets (500 mg total) by mouth 2 (two) times daily  fluticasone propionate (FLONASE) 50 mcg/actuation nasal spray  No  Sig:  Place 1 spray into both nostrils 2 (two) times daily  ketoconazole (NIZORAL) 2 % cream  No  Sig: Apply 1 Application topically once daily  lacoSAMide (VIMPAT) 150 mg tablet  No  Sig: Take 1 tablet (150 mg total) by mouth every 12 (twelve) hours  levETIRAcetam  (KEPPRA ) 1000 MG tablet  No  Sig: Take 2 tablets (2,000 mg total) by mouth every 12 (twelve) hours  levothyroxine  (SYNTHROID ) 125 MCG tablet  No  Sig: Take 0.5 tablets (62.5 mcg total) by mouth once daily Take on an empty stomach with a glass of water  at least 30-60 minutes before breakfast.  melatonin 5 mg Tab  No  Sig: Take 1 tablet (5 mg total) by mouth at bedtime as needed (insomnia)  mupirocin (BACTROBAN) 2 % ointment  No  Sig: Apply 2 Applications topically once daily  polyethylene glycol (MIRALAX ) powder  No  Sig: Take 17 g by mouth once daily Mix in 4-8ounces of fluid prior to taking.  risperiDONE  (RISPERDAL ) 3 MG tablet  No  Sig: Take 1 tablet (3 mg  total) by mouth 2 (two) times daily    Facility-Administered Medications: None    Allergies:   Allergies  Allergen Reactions  . Penicillin Hives    Physical Exam:  Vital signs in last 24 hours: Temp:  [37.3 C (99.2 F)] 37.3 C (99.2 F) Heart Rate:  [71-77] 71 Resp:  [16-24] 17 BP: (111-125)/(66-77) 119/76 Temp (24hrs), Avg:37.3 C (99.2 F), Min:37.3 C (99.2 F), Max:37.3 C (99.2 F)  SpO2: 96 %   General: Well appearing. In no distress.  Neurologic Exam: Mental Status: Awake. Alert. Oriented to person, place, and time. Speech: No aphasia. No dysarthria. Naming and repetition intact. Cranial Nerves:    II,III: Pupils 3 mm and reactive to light bilaterally.    III,IV,VI: EOMI w/o nystagmus. No ptosis   V: Sensation intact to light touch   VII: Face symmetric without weakness   VIII: Hearing to voice intact   IX,X: Voice normal, palate elevates symmetrically   XI: SCM/trap full   XII: Tongue protrudes midline. No atrophy or fasciculation Motor: Normal bulk and tone. No arm drift. No tremors.    Strength: Dlt Bic Tri WE WF HF KF KE PF DF    Left 5 5 5 5 5 5 5 5 5 5     Right 5 5 5 5 5 5 5 5 5 5   Sensation: Intact to light touch throughout. No extinction to DSS.  Coordination: Intact FTN bilaterally. Reflexes: 2+ BUE and patellar.  Gait: Normal gait, stride, and turn.   Data:  Pertinent Labs: Recent Labs  Lab 02/18/24 1215  WBC 4.4  HGB 13.8  HCT 42.1  PLT 189   Recent Labs  Lab 02/18/24 1215  NA 139  K 4.3  CL 102  CO2 24  BUN 9  CREATININE 1.0  CALCIUM 9.8  ALKPHOS 50  ALT 12*  AST 23  GLUCOSE 76   Relevant Imaging/Procedures: CT Brain IMPRESSION:  1.  No acute intracranial abnormalities.  2.  Unchanged left parietal and temporal encephalomalacia with associated cortical laminar necrosis.  Assessment and Plan:  [name] is a 23 y.o. Male who presents for breakthrough seizures with PMH: anti-NMDA receptor encephalitis, left  temporal lobe necrosis, focal epilepsy, GTC, conversion disorder, hypothyroidism, autism.   Neurology consulted for breakthrough seizures.   Patient had several seizures over the last few weeks since being adopted and brought home. He missed a day's worth of Keppra   and Lacosamide on 9/13. Thereafter, he was only getting half his normal dose of Keppra . His recent seizures are most likely triggered in the setting of medication decrease. He was back to baseline when seen with the team. Brain imaging did not show any new concerning findings. The chronic encephalomalacia was again noted.   Patient sent home with correct dosing of Keppra  noted and Valtoco nasal spray as seizure abortive. He has an appointment with outpatient neurology 10/21.   RECOMMENDATIONS: - Continue Keppra  2,000 MG BID - Continue Lacosamide 150 MG BID - Continue divalproex  DR 1,000 MG BID - Continue oxcarbazepine 300 MG BID - Valtoco nasal spray for acute seizure  - Keep neurology outpatient follow-up for 03/18/2024  # Disposition: As per primary team.  CNS imaging personally reviewed. Recommendations were conveyed to primary team.  Please Page [phone] with any further questions   Freya JUDITHANN Blush, MD Neurology Resident PGY-2   The patient was staffed with Dr. Heidi Purl who is in agreement with the above stated plan.  ------------------------------------------------------------------------------- Attestation signed by Purl Heidi Dales, MD at 02/19/2024  1:17 PM Attestation Statement:   I personally saw and evaluated the patient, and participated in the management and treatment plan as documented in the resident/fellow note.  I spent a total of 45 minutes in both face-to-face and non-face-to-face activities, excluding procedures performed, for this visit on the date of this encounter.     FURKAN MUHAMMET YILMAZ, MD  -------------------------------------------------------------------------------

## 2024-02-19 ENCOUNTER — Encounter (HOSPITAL_COMMUNITY): Payer: Self-pay

## 2024-02-19 ENCOUNTER — Other Ambulatory Visit: Payer: Self-pay

## 2024-02-19 ENCOUNTER — Emergency Department (HOSPITAL_COMMUNITY)
Admission: EM | Admit: 2024-02-19 | Discharge: 2024-02-19 | Disposition: A | Discharge status: Home / Self Care | Attending: Emergency Medicine | Admitting: Emergency Medicine

## 2024-02-19 DIAGNOSIS — G40909 Epilepsy, unspecified, not intractable, without status epilepticus: Secondary | ICD-10-CM | POA: Insufficient documentation

## 2024-02-19 DIAGNOSIS — R569 Unspecified convulsions: Secondary | ICD-10-CM | POA: Diagnosis present

## 2024-02-19 LAB — CBC WITH DIFFERENTIAL/PLATELET
Abs Immature Granulocytes: 0.01 K/uL (ref 0.00–0.07)
Basophils Absolute: 0 K/uL (ref 0.0–0.1)
Basophils Relative: 1 %
Eosinophils Absolute: 0.3 K/uL (ref 0.0–0.5)
Eosinophils Relative: 5 %
HCT: 42.3 % (ref 39.0–52.0)
Hemoglobin: 13.7 g/dL (ref 13.0–17.0)
Immature Granulocytes: 0 %
Lymphocytes Relative: 45 %
Lymphs Abs: 2.4 K/uL (ref 0.7–4.0)
MCH: 28.5 pg (ref 26.0–34.0)
MCHC: 32.4 g/dL (ref 30.0–36.0)
MCV: 88.1 fL (ref 80.0–100.0)
Monocytes Absolute: 0.5 K/uL (ref 0.1–1.0)
Monocytes Relative: 10 %
Neutro Abs: 2 K/uL (ref 1.7–7.7)
Neutrophils Relative %: 39 %
Platelets: 188 K/uL (ref 150–400)
RBC: 4.8 MIL/uL (ref 4.22–5.81)
RDW: 12.8 % (ref 11.5–15.5)
WBC: 5.2 K/uL (ref 4.0–10.5)
nRBC: 0 % (ref 0.0–0.2)

## 2024-02-19 LAB — COMPREHENSIVE METABOLIC PANEL WITH GFR
ALT: 11 U/L (ref 0–44)
AST: 24 U/L (ref 15–41)
Albumin: 4.1 g/dL (ref 3.5–5.0)
Alkaline Phosphatase: 58 U/L (ref 38–126)
Anion gap: 11 (ref 5–15)
BUN: 10 mg/dL (ref 6–20)
CO2: 24 mmol/L (ref 22–32)
Calcium: 9.8 mg/dL (ref 8.9–10.3)
Chloride: 105 mmol/L (ref 98–111)
Creatinine, Ser: 0.83 mg/dL (ref 0.61–1.24)
GFR, Estimated: >60 mL/min (ref >60)
Glucose, Bld: 108 mg/dL — ABNORMAL HIGH (ref 70–99)
Potassium: 3.8 mmol/L (ref 3.5–5.1)
Sodium: 140 mmol/L (ref 135–145)
Total Bilirubin: <0.2 mg/dL (ref 0.0–1.2)
Total Protein: 7.8 g/dL (ref 6.5–8.1)

## 2024-02-19 LAB — MAGNESIUM: Magnesium: 1.9 mg/dL (ref 1.7–2.4)

## 2024-02-19 MED ORDER — PERAMPANEL 2 MG PO TABS: 2.0000 mg | ORAL_TABLET | Freq: Every day | ORAL | 0 refills | Status: AC

## 2024-02-19 MED ORDER — PERAMPANEL 8 MG PO TABS
4.0000 mg | ORAL_TABLET | Freq: Once | ORAL | Status: AC
  Administered 2024-02-19: 4 mg via ORAL
  Filled 2024-02-19: qty 0.5

## 2024-02-19 NOTE — ED Notes (Signed)
Seizure pads applied to patient's bed.  

## 2024-02-19 NOTE — Discharge Instructions (Addendum)
 Continue taking all medications as prescribed.  Blood work that was done today was normal.  We have added a new medication for seizures.  This medication is called perampanel .  You may take 2 mg at bedtime each day.  You may continue taking all other medications as prescribed.  Return to the emergency room for recurrent seizures.

## 2024-02-19 NOTE — ED Triage Notes (Signed)
 Pt arrives EMS with reports of multiple seizures per mom over the course of 20 min. Pt has hx of seizures and had one yesterday as well. Mom reports seizures were twitching movements which aren't like his typical seizures. Pt has been out of medication for the last four days due to an issue at the pharmacy. Pt at baseline on arrival, hx of intellectual disability. Pt denies any pain

## 2024-02-19 NOTE — ED Provider Notes (Signed)
 [place] EMERGENCY DEPARTMENT AT [facility] Provider Note  CSN: [id number] Arrival date & time: 02/19/24 1952  Chief Complaint(s) Seizures  HPI David [name] is a 23 y.o. male with a complex history of seizure disorder who is here today after repeated seizure episode.  Patient here today with foster parents.  Patient was seen at [facility] yesterday, determined that he had been incorrectly being given his seizure medications, that was corrected yesterday following an in ED consultation with neurology.  While at church today, patient had a seizure that was described as different from his previous seizures.  Parents describe the patient having 30 minutes of mild seizures, which seemed to abort spontaneously, followed by 2 big seizures that were more consistent with his prior.  They stated that the seizures are very different from previous episodes.  Patient returned to baseline.   Past Medical History Past Medical History:  Diagnosis Date   Autism    Intellectual disability    Moderate   Limbic encephalitis    Seizures (HCC)    Speech delay    Thyroid  disease    Patient Active Problem List   Diagnosis Date Noted   Partial symptomatic epilepsy with complex partial seizures, not intractable, without status epilepticus (HCC) 07/08/2020   S/P craniotomy 07/08/2020   Autism 10/26/2019   Recurrent seizures (HCC) 10/23/2019   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   Seizure disorder (HCC) 04/24/2018   Seizure (HCC) 04/24/2017   Acne 11/03/2016   Depression 11/03/2016   Intellectual disability 11/03/2016   History of encephalitis 11/03/2016   Epilepsy (HCC) 11/03/2016   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 Authorizing Provider  perampanel  (FYCOMPA ) 2 MG tablet Take 1 tablet (2  mg total) by mouth at bedtime. 02/19/24 03/20/24 Yes Mannie Pac T, DO  cholecalciferol  (VITAMIN D3) 25 MCG (1000 UNIT) tablet Take 1 tablet (1,000 Units total) by mouth daily. 10/28/19   Ghimire, Donalda HERO, MD  divalproex  (DEPAKOTE  ER) 250 MG 24 hr tablet Take 5 tablets (1,250 mg total) by mouth 2 (two) times daily. 04/21/20 05/21/20  Freddi Hamilton, MD  divalproex  (DEPAKOTE ) 500 MG DR tablet 2 + 1/2 tab bid 07/08/20   Onita Duos, MD  levETIRAcetam  (KEPPRA ) 500 MG tablet Take 2 tablets (1,000 mg total) by mouth 2 (two) times daily. 07/08/20   Onita Duos, MD  levothyroxine  (SYNTHROID ) 125 MCG tablet Take 125 mcg by mouth daily before breakfast.    [provider]  melatonin 5 MG TABS Take 5 mg by mouth at bedtime.    [provider]  OLANZapine  (ZYPREXA ) 5 MG tablet Take 5 mg by mouth daily as needed (agitation).    [provider]  risperiDONE  (RISPERDAL ) 0.5 MG tablet Take 0.5 mg by mouth 2 (two) times daily.    [provider]  Past Surgical History Past Surgical History:  Procedure Laterality Date   BRAIN SURGERY     Brain biopsy   GASTROSTOMY TUBE REVISION     Family History Family History  Problem Relation Age of Onset   Other Mother        unknown history   Other Father        unknown history    Social History Social History   Tobacco Use   Smoking status: Passive Smoke Exposure - Never Smoker   Smokeless tobacco: Never  Substance Use Topics   Alcohol use: No   Drug use: No   Allergies Penicillins  Review of Systems Review of Systems  Physical Exam Vital Signs  I have reviewed the triage vital signs BP 134/73   Pulse 88   Temp 98.5 F (36.9 C)   Resp 18   SpO2 95%   Physical Exam Vitals and nursing note reviewed.  Eyes:     Pupils: Pupils are equal, round, and reactive to light.  Pulmonary:      Effort: Pulmonary effort is normal.  Musculoskeletal:        General: Normal range of motion.     Cervical back: Normal range of motion.  Neurological:     General: No focal deficit present.     Mental Status: He is alert.     Cranial Nerves: No cranial nerve deficit.     Motor: No weakness.     Gait: Gait normal.     ED Results and Treatments Labs (all labs ordered are listed, but only abnormal results are displayed) Labs Reviewed  COMPREHENSIVE METABOLIC PANEL WITH GFR - Abnormal; Notable for the following components:      Result Value   Glucose, Bld 108 (*)    All other components within normal limits  CBC WITH DIFFERENTIAL/PLATELET  MAGNESIUM   LEVETIRACETAM  LEVEL                                                                                                                          Radiology No results found.  Pertinent labs & imaging results that were available during my care of the patient were reviewed by me and considered in my medical decision making (see MDM for details).  Medications Ordered in ED Medications  perampanel  (FYCOMPA ) tablet 4 mg (has no administration in time range)  Procedures Procedures  (including critical care time)  Medical Decision Making / ED Course   This patient presents to the ED for concern of seizure, this involves an extensive number of treatment options, and is a complaint that carries with it a high risk of complications and morbidity.  The differential diagnosis includes breakthrough seizure.  MDM: Patient overall well-appearing at this time.  He is at his baseline.  Had a discussion with parents about the current medications patient is taking.  He has been compliant with his medications.  I out to our neurology team, spoke with Dr. Vanessa, who reviewed the patient's recent neurology note as  well as medications.  He recommends starting the patient on Perampanel .  Will check basic labs on the patient.  Do not believe he requires CT imaging as he had this performed yesterday.  Family comfortable with this plan.  Labs reviewed, no leukocytosis, normal renal function.  Patient requesting to be discharged as he had normal labs yesterday and they live far away.  Will discharge.   Additional history obtained: -Additional history obtained from family at bedside -External records from outside source obtained and reviewed including: Chart review including previous notes, labs, imaging, consultation notes   Lab Tests: -I ordered, reviewed, and interpreted labs.   The pertinent results include:   Labs Reviewed  COMPREHENSIVE METABOLIC PANEL WITH GFR - Abnormal; Notable for the following components:      Result Value   Glucose, Bld 108 (*)    All other components within normal limits  CBC WITH DIFFERENTIAL/PLATELET  MAGNESIUM   LEVETIRACETAM  LEVEL       Medicines ordered and prescription drug management: Meds ordered this encounter  Medications   perampanel  (FYCOMPA ) tablet 4 mg   perampanel  (FYCOMPA ) 2 MG tablet    Sig: Take 1 tablet (2 mg total) by mouth at bedtime.    Dispense:  30 tablet    Refill:  0    -I have reviewed the patients home medicines and have made adjustments as needed   Cardiac Monitoring: The patient was maintained on a cardiac monitor.  I personally viewed and interpreted the cardiac monitored which showed an underlying rhythm of: Normal sinus rhythm  Social Determinants of Health:  Factors impacting patients care include: Multiple medical comorbidities including autism spectrum disorder, seizure disorder.   Reevaluation: After the interventions noted above, I reevaluated the patient and found that they have :improved  Co morbidities that complicate the patient evaluation  Past Medical History:  Diagnosis Date   Autism    Intellectual  disability    Moderate   Limbic encephalitis    Seizures (HCC)    Speech delay    Thyroid  disease       Dispostion: I considered admission for this patient, however after discussion with neurology, patient is appropriate for discharge.     Final Clinical Impression(s) / ED Diagnoses Final diagnoses:  Seizure disorder (HCC)     @PCDICTATION @    Mannie Pac T, DO 02/19/24 2137

## 2024-02-22 LAB — LEVETIRACETAM LEVEL: Levetiracetam Lvl: 20.5 ug/mL (ref 10.0–40.0)
